# Patient Record
Sex: Female | Born: 1990 | Race: Black or African American | Hispanic: No | Marital: Single | Smoking: Never smoker
Health system: Southern US, Community
[De-identification: ages and names within clinical notes are randomized; demographics above are authoritative.]

## PROBLEM LIST (undated history)

## (undated) DIAGNOSIS — G43909 Migraine, unspecified, not intractable, without status migrainosus: Secondary | ICD-10-CM

## (undated) DIAGNOSIS — R10814 Left lower quadrant abdominal tenderness: Secondary | ICD-10-CM

## (undated) DIAGNOSIS — R87612 Low grade squamous intraepithelial lesion on cytologic smear of cervix (LGSIL): Secondary | ICD-10-CM

## (undated) DIAGNOSIS — J309 Allergic rhinitis, unspecified: Secondary | ICD-10-CM

## (undated) DIAGNOSIS — N83299 Other ovarian cyst, unspecified side: Secondary | ICD-10-CM

## (undated) HISTORY — DX: Low grade squamous intraepithelial lesion on cytologic smear of cervix (LGSIL): R87.612

## (undated) HISTORY — PX: DILATION AND CURETTAGE OF UTERUS: SHX78

## (undated) HISTORY — DX: Migraine, unspecified, not intractable, without status migrainosus: G43.909

## (undated) HISTORY — DX: Left lower quadrant abdominal tenderness: R10.814

## (undated) HISTORY — DX: Allergic rhinitis, unspecified: J30.9

## (undated) HISTORY — PX: OTHER SURGICAL HISTORY: SHX169

## (undated) HISTORY — DX: Other ovarian cyst, unspecified side: N83.299

---
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
## 2008-07-26 ENCOUNTER — Other Ambulatory Visit: Admission: RE | Admit: 2008-07-26 | Discharge: 2008-07-26 | Payer: Self-pay | Admitting: Gynecology

## 2008-09-28 ENCOUNTER — Ambulatory Visit: Payer: Self-pay | Admitting: Women's Health

## 2008-10-06 ENCOUNTER — Ambulatory Visit: Payer: Self-pay | Admitting: Women's Health

## 2008-12-11 ENCOUNTER — Ambulatory Visit: Payer: Self-pay | Admitting: Gynecology

## 2009-07-18 ENCOUNTER — Ambulatory Visit: Payer: Self-pay | Admitting: Women's Health

## 2009-11-19 ENCOUNTER — Ambulatory Visit: Payer: Self-pay | Admitting: Diagnostic Radiology

## 2009-11-19 ENCOUNTER — Emergency Department (HOSPITAL_BASED_OUTPATIENT_CLINIC_OR_DEPARTMENT_OTHER): Admission: EM | Admit: 2009-11-19 | Discharge: 2009-11-19 | Payer: Self-pay | Admitting: Emergency Medicine

## 2014-10-03 ENCOUNTER — Ambulatory Visit: Payer: Self-pay | Admitting: Women's Health

## 2014-10-11 ENCOUNTER — Encounter: Payer: Self-pay | Admitting: Gynecologic Oncology

## 2014-10-13 ENCOUNTER — Ambulatory Visit: Payer: 59 | Attending: Gynecologic Oncology | Admitting: Gynecologic Oncology

## 2014-10-13 ENCOUNTER — Ambulatory Visit: Payer: 59

## 2014-10-13 ENCOUNTER — Encounter: Payer: Self-pay | Admitting: Gynecologic Oncology

## 2014-10-13 VITALS — BP 104/71 | HR 82 | Resp 20 | Ht 68.5 in | Wt 166.7 lb

## 2014-10-13 DIAGNOSIS — Z79899 Other long term (current) drug therapy: Secondary | ICD-10-CM | POA: Insufficient documentation

## 2014-10-13 DIAGNOSIS — R971 Elevated cancer antigen 125 [CA 125]: Secondary | ICD-10-CM

## 2014-10-13 DIAGNOSIS — N839 Noninflammatory disorder of ovary, fallopian tube and broad ligament, unspecified: Secondary | ICD-10-CM | POA: Diagnosis not present

## 2014-10-13 DIAGNOSIS — N83201 Unspecified ovarian cyst, right side: Secondary | ICD-10-CM

## 2014-10-13 DIAGNOSIS — Z803 Family history of malignant neoplasm of breast: Secondary | ICD-10-CM | POA: Insufficient documentation

## 2014-10-13 DIAGNOSIS — N8320 Unspecified ovarian cysts: Secondary | ICD-10-CM | POA: Diagnosis not present

## 2014-10-13 DIAGNOSIS — Z791 Long term (current) use of non-steroidal anti-inflammatories (NSAID): Secondary | ICD-10-CM | POA: Insufficient documentation

## 2014-10-13 DIAGNOSIS — N838 Other noninflammatory disorders of ovary, fallopian tube and broad ligament: Secondary | ICD-10-CM

## 2014-10-13 DIAGNOSIS — N83202 Unspecified ovarian cyst, left side: Secondary | ICD-10-CM

## 2014-10-13 LAB — LACTATE DEHYDROGENASE (CC13): LDH: 179 U/L (ref 125–245)

## 2014-10-13 NOTE — Patient Instructions (Addendum)
Plan for your pre-op appointment at [facility] on November 2 at 11:00am and surgery on November 6 with Dr. Nancy Marus.  We will contact you about your lab work from today and with your CT scan results when available.  Please call for any questions or concerns.

## 2014-10-13 NOTE — Progress Notes (Signed)
Consult Note: Gyn-Onc  Consult was requested by Dr. Carren Rang for the evaluation of Monica Pratt 23 y.o. female with bilateral ovarian cysts  CC:  Chief Complaint  Patient presents with  . Ovarian Masses    New patient    Assessment/Plan:  Monica Pratt  is a 23 y.o.  year old [id number] who is seen in consultation at the request of Dr Carren Rang for bilateral complex ovarian cysts and an elevated CA 125.  I discussed with Monica Pratt that I recommend cyst removal. It is unclear if these are malignant or benign and elevations in CA 125 can occur in the setting of benign or LMP tumors of the ovaries. In order to preserve fertility and endogenous hormonal function, I endorse attempting to remove the cysts and preserve any normal ovarian tissue that is possible. I explained to Monica Pratt that this is not always possible. I also explained that in an attempt to perform an ovarian cystectomy, cyst rupture may occur, and if so, this can upstage a cancer should one exist.  I discussed that if a cancer is diagnosed on frozen section of the cysts, we would recommend completion BSO. I discussed the possibility of retaining her uterus if it is not apparently involved by cancer as this would maintain a potential for donor egg and IVF pregnancy. The patient is not interested in this option and elects for completion hyst, bso if malignancy is diagnosed at the time of frozen section. She expressed understanding that this would result in permanent infertility.  I believe that a minimally invasive approach could be attempted for her with robotic assistance (with the increased range of motion of the instruments facilitating approaching these large masses). However, she understands that conversion to laparotomy may be necessary to accomplish the surgery more safely. I have scheduled her for a robotic bilateral ovarian cystectomies, possible BSO, possible staging. In order to facilitate a sooner operative date, we will attempt to have this  scheduled with one of my partners.  I have ordered additional tumor markers (for germ and stromal cell tumors) which are more common cell type malignancies in women of this age group. I have also ordered a CT of the abdomen and pelvis to rule out apparent bulky upper abdominal or metastatic disease which would change our plans for operative approach and counseling.  HPI: Monica Pratt is a very pleasant 23 year old [id number] who has bilateral complex ovarian cysts.  In 2012 (during her first pregnancy) a "large" ovarian cyst was identified on the right. The patient elected for expectant management before and after the pregnancy. It's features on ultrasound were felt to be consistent with a dermoid cyst.  In July 2015 she began feeling uncomfortable and bloated and sought consultation again with a gynecologist. An ultrasound was performed which showed an intrauterine pregnancy in addition to a right ovarian cyst (normal uterus, left ovary normal with small corpus luteum, complex right oarian cyst extending to the mid abdomen, measuring 17.2cm x 10cm. The walls were thin and smoothly marginated). The mass is largely cystic with internal septation and solid component centrally). The pregnancy was subsequently terminated and the patient underwent a followup US in September 2015. It revealed bilateral ovarian cysts (right 12cm with cystic and solid (+perfusion components), left 13.6cm with solid and cystic compenents (+perfusion to solid areas)).  In October 2015 she saw Dr Carren Rang for a new consultation and a third [place] was performed (on 10/03/14) which showed a left ovary measuring 11.5x5.5x7.9cm and a right ovary with  11.7x8.8x10.4cm. "probably dermoids". They were irregularly shaped, hyperechoic, with solid mass within (4cm). There was no fluid in the cul de sac.  CA 125 was drawn on 10/03/14 and was elevated at 445 U/mL.  The patient reports persistent discomfort and bloating and occasional sharp left lower quadrant  pains. She reports normal menses with no intermenstrual bleeding. She uses loestrin OCP. Her only familial cancer history is an aunt with breast cancer at age 57.  Interval History: She continues to have the above stated symptoms. She is in between jobs at present.  Current Meds:  Outpatient Encounter Prescriptions as of 10/13/2014  Medication Sig  . docusate sodium (COLACE) 100 MG capsule Take 100 mg by mouth 2 (two) times daily as needed for mild constipation.  Marland Kitchen ibuprofen (ADVIL,MOTRIN) 800 MG tablet Take 800 mg by mouth every 8 (eight) hours as needed.    Allergy:  Allergies  Allergen Reactions  . Imitrex [Sumatriptan]     Angioedema    Social Hx:   History   Social History  . Marital Status: Single    Spouse Name: N/A    Number of Children: N/A  . Years of Education: N/A   Occupational History  . Not on file.   Social History Main Topics  . Smoking status: Never Smoker   . Smokeless tobacco: Not on file  . Alcohol Use: Yes     Comment: socially  . Drug Use: No  . Sexual Activity: Not on file   Other Topics Concern  . Not on file   Social History Narrative  . No narrative on file    Past Surgical Hx:  Past Surgical History  Procedure Laterality Date  . Cesarean section      Past Medical Hx:  Past Medical History  Diagnosis Date  . Allergic rhinitis   . Migraine   . Left lower quadrant abdominal tenderness   . Complex ovarian cyst     Past Gynecological History:  Cesarean x 1. Loestrin OCP use. EAbx 2. No hx of abnormal pap smears.  No LMP recorded.  Family Hx:  Family History  Problem Relation Age of Onset  . Asthma Father   . Migraines Father   . Asthma Brother   . Asthma Paternal Uncle   . Cancer Paternal Aunt 20    breast    Review of Systems:  Constitutional  Feels well,    ENT Normal appearing ears and nares bilaterally Skin/Breast  No rash, sores, jaundice, itching, dryness Cardiovascular  No chest pain, shortness of  breath, or edema  Pulmonary  No cough or wheeze.  Gastro Intestinal  No nausea, vomitting, or diarrhoea. No bright red blood per rectum, + intermittent constipation.  +vague bloating and low left abdominal pains. Genito Urinary  No frequency, urgency, dysuria,  Musculo Skeletal  No myalgia, arthralgia, joint swelling or pain  Neurologic  No weakness, numbness, change in gait,  Psychology  No depression, anxiety, insomnia.   Vitals:  Blood pressure 104/71, pulse 82, resp. rate 20, height 5' 8.5" (1.74 m), weight 166 lb 11.2 oz (75.615 kg).  Physical Exam: WD in NAD Neck  Supple NROM, without any enlargements.  Lymph Node Survey No cervical supraclavicular or inguinal adenopathy Cardiovascular  Pulse normal rate, regularity and rhythm. S1 and S2 normal.  Lungs  Clear to auscultation bilateraly, without wheezes/crackles/rhonchi. Good air movement.  Skin  No rash/lesions/breakdown  Psychiatry  Alert and oriented to person, place, and time  Abdomen  Normoactive bowel sounds, abdomen  soft, non-tender and thin without evidence of hernia.  Back No CVA tenderness Genito Urinary  Vulva/vagina: Normal external female genitalia.  No lesions. No discharge or bleeding.  Bladder/urethra:  No lesions or masses, well supported bladder  Vagina: normal appearing  Cervix: Displaced posteriorally due to pelvic masses. Normal appearing, no lesions.  Uterus: Small, mobile, no parametrial involvement or nodularity.  Adnexa: smooth cystic masses in the mid pelvis. No palpable adherence to the pelvic floor or rectum. Rectal  Good tone, no masses no cul de sac nodularity.  Extremities  No bilateral cyanosis, clubbing or edema.   Donaciano Eva, MD  10/13/2014, 3:17 PM

## 2014-10-17 LAB — AFP TUMOR MARKER-PREVIOUS METHOD: AFP Tumor Marker: <1.3 ng/mL (ref 0.0–8.0)

## 2014-10-17 LAB — BETA 2 MICROGLOBULIN, SERUM: Beta-2 Microglobulin: 1.28 mg/L (ref <=2.51)

## 2014-10-17 LAB — BETA HCG QUANT (REF LAB): BETA HCG, TUMOR MARKER: 659 m[IU]/mL — AB (ref <5.0)

## 2014-10-17 LAB — AFP TUMOR MARKER: AFP-Tumor Marker: 2.1 ng/mL (ref <6.1)

## 2014-10-17 LAB — INHIBIN A: INHIBIN-A: 7.4 pg/mL

## 2014-10-18 ENCOUNTER — Encounter (HOSPITAL_COMMUNITY): Payer: Self-pay

## 2014-10-18 ENCOUNTER — Ambulatory Visit (HOSPITAL_COMMUNITY)
Admission: RE | Admit: 2014-10-18 | Discharge: 2014-10-18 | Disposition: A | Discharge status: Home / Self Care | Payer: 59 | Source: Ambulatory Visit | Attending: Gynecologic Oncology | Admitting: Gynecologic Oncology

## 2014-10-18 DIAGNOSIS — D270 Benign neoplasm of right ovary: Secondary | ICD-10-CM | POA: Diagnosis not present

## 2014-10-18 DIAGNOSIS — R971 Elevated cancer antigen 125 [CA 125]: Secondary | ICD-10-CM

## 2014-10-18 DIAGNOSIS — D271 Benign neoplasm of left ovary: Secondary | ICD-10-CM | POA: Diagnosis not present

## 2014-10-18 DIAGNOSIS — R188 Other ascites: Secondary | ICD-10-CM | POA: Diagnosis not present

## 2014-10-18 DIAGNOSIS — N83202 Unspecified ovarian cyst, left side: Secondary | ICD-10-CM

## 2014-10-18 DIAGNOSIS — D259 Leiomyoma of uterus, unspecified: Secondary | ICD-10-CM | POA: Diagnosis not present

## 2014-10-18 DIAGNOSIS — N8320 Unspecified ovarian cysts: Secondary | ICD-10-CM | POA: Diagnosis present

## 2014-10-18 DIAGNOSIS — N83201 Unspecified ovarian cyst, right side: Secondary | ICD-10-CM

## 2014-10-18 DIAGNOSIS — N838 Other noninflammatory disorders of ovary, fallopian tube and broad ligament: Secondary | ICD-10-CM

## 2014-10-18 MED ORDER — IOHEXOL 300 MG/ML  SOLN
100.0000 mL | Freq: Once | INTRAMUSCULAR | Status: AC | PRN
  Administered 2014-10-18: 100 mL via INTRAVENOUS

## 2014-10-19 ENCOUNTER — Telehealth: Payer: Self-pay | Admitting: Gynecologic Oncology

## 2014-10-19 NOTE — Telephone Encounter (Signed)
Informed patient of results. Discussed that she has an elevated HCG. The patient reports termination of pregnancy on 09/30/14 which is only 1 week prior to having the HCG level drawn as a tumor marker. I feel this most likely reflects the status of recent pregnancy and not active pregnancy or choriocarcinoma. I recommend rechecking this value next week to confirm it is normalizing.  I personally reviewed the CT images in addition to the report. The images look most consistent with a benign cystic process. They are mostly cystic but do fill the pelvis into the upper abdomen. I will discuss the case further with her treating surgeon regarding feasibility of a minimally invasive approach.  We will also recheck her CA 125, as now she is no longer pregnant, we may see a reduction in this level which will be further reassuring.  Donaciano Eva, MD

## 2014-11-02 ENCOUNTER — Ambulatory Visit: Payer: 59 | Attending: Gynecologic Oncology | Admitting: Gynecologic Oncology

## 2014-11-02 ENCOUNTER — Encounter: Payer: Self-pay | Admitting: Gynecologic Oncology

## 2014-11-02 ENCOUNTER — Ambulatory Visit: Payer: 59

## 2014-11-02 VITALS — BP 108/73 | HR 93 | Temp 98.3°F | Resp 18 | Ht 68.5 in | Wt 168.9 lb

## 2014-11-02 DIAGNOSIS — N839 Noninflammatory disorder of ovary, fallopian tube and broad ligament, unspecified: Secondary | ICD-10-CM

## 2014-11-02 DIAGNOSIS — N838 Other noninflammatory disorders of ovary, fallopian tube and broad ligament: Secondary | ICD-10-CM | POA: Diagnosis present

## 2014-11-02 DIAGNOSIS — R319 Hematuria, unspecified: Secondary | ICD-10-CM

## 2014-11-02 DIAGNOSIS — N8320 Unspecified ovarian cysts: Secondary | ICD-10-CM | POA: Insufficient documentation

## 2014-11-02 DIAGNOSIS — N390 Urinary tract infection, site not specified: Secondary | ICD-10-CM

## 2014-11-02 LAB — URINALYSIS, MICROSCOPIC - CHCC
Bilirubin (Urine): NEGATIVE
Glucose: NEGATIVE mg/dL
Ketones: NEGATIVE mg/dL
NITRITE: NEGATIVE
PH: 8.5 (ref 4.6–8.0)
PROTEIN: 100 mg/dL
Specific Gravity, Urine: 1.01 (ref 1.003–1.035)
UROBILINOGEN UR: 0.2 mg/dL (ref 0.2–1)

## 2014-11-02 MED ORDER — CIPROFLOXACIN HCL 500 MG PO TABS: 500.0000 mg | ORAL_TABLET | Freq: Two times a day (BID) | ORAL | Status: DC

## 2014-11-02 NOTE — Progress Notes (Signed)
Follow Up Note: Gyn-Onc  Monica Pratt 23 y.o. female  CC:  Chief Complaint  Patient presents with  . Ovarian mass    Post-op follow up  . Trial Voiding    HPI:  Monica Pratt is a 23 year old, G3P1, referred by Dr. Carren Rang for bilateral complex ovarian cysts.  In 2012 (during her first pregnancy), a "large" ovarian cyst was identified on the right and based on ultrasound findings, it was thought to be a dermoid cyst.  Expectant management before and after the pregnancy was selected at that time.  In July 2015, she sought the care of a gynecologist for evaluation of abdominal discomfort and bloating. An ultrasound performed at that time revealed an intrauterine pregnancy in addition to a right ovarian cyst (normal uterus, left ovary normal with small corpus luteum, complex right ovarian cyst extending to the mid abdomen, measuring 17.2cm x 10cm. The walls were thin and smoothly marginated). The mass was largely cystic with internal septation and solid component centrally). The pregnancy was subsequently terminated and the patient underwent a followup US in September 2015. It revealed bilateral ovarian cysts (right 12cm with cystic and solid (+perfusion components), left 13.6cm with solid and cystic compenents (+perfusion to solid areas)).  Dr Carren Rang was consulted in October 2015 for a new patient consultation.  On 10/03/14, an [place] was performed, which showed a left ovary measuring 11.5x5.5x7.9cm and a right ovary with 11.7x8.8x10.4cm noted as "probably dermoids". They were irregularly shaped, hyperechoic, with solid mass within (4cm). There was no fluid in the cul de sac.  CA 125 was drawn on 10/03/14 and was elevated at 445 U/mL.  She was seen by Dr. Denman George with Gynecologic Oncology on 10/13/14.  Surgical intervention was recommended at that time and she was referred to [facility] for an earlier OR date with Dr. Nancy Marus.  Tumor markers were drawn on 10/13/14 and resulted: HCG 659.0, AFP 2.1, Inhibin A 7.4,  and LDH 179.  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  12.0 cm left ovarian dermoid.  Suspected 2.1 cm intramural right uterine body fibroid.   On 10/27/14, she underwent a removal of bilobed right-sided dermoid ovarian cyst via pfannenstiel incision by Dr. Nancy Marus.  Operative findings included: 1. Bilobed right-sided ovarian cyst c/w dermoid. Initial cyst encountered was within the ovarian parenchyma, measuring approximately 10cm. This drained clear fluid. It contained two small nodules, one measuring approximately 3cm and another measuring approximately 1cm that contained thick, green-yellow substance and hair. The second lobe of the dermoid was adherent to the omentum and measured approximately 10cm as well. This drained clear yellow fluid. There were a few small nodules within containing a thicker fluid; however, these were not drained. This cyst was removed in it's entirety and sent to pathology. Intra-operative frozen pathology was initially ordered; however, this was canceled following a discussion with pathology who noted that the gross appearance was consistent with dermoid, which is not able to undergo frozen section secondary to calcium deposits. 2. Normal appearing uterus and left tube and ovary. Normal appearing right fallopian tube. 3. Normal appearing appendix. 4. Omentum examined with no further abnormalities identified.  Final pathology still under review at this time.  Post-operatively, a voiding trial was unsuccessful and she was discharged home with a foley catheter with plans for another voiding trial on November 12 in [place].   Interval History:  She presents today for foley removal and for a voiding trial status post discharge from [facility].  She states she has been doing well but she feels she has a UTI from the catheter.  Reporting burning around the urethra, blood-tinged urine, and frothy urine that developed two days ago.  Denies fever or chills.   Adequate PO intake reported.  Minimal pain and adequate pain relief reported with percocet use intermittently.  She has not had a bowel movement since surgery but has been taking stool softeners twice daily.  No concerns voiced about her incision.  Denies erythema or drainage.  No concerns voiced.    Review of Systems  Constitutional: Feels well.  No fever, chills, weakness, fatigue, early satiety, appetite changes, unintentional weight loss or gain.  Cardiovascular: No chest pain, shortness of breath, or edema.  Pulmonary: No cough or wheeze.  Gastrointestinal: No nausea, vomiting, or diarrhea. No bright red blood per rectum or change in bowel movement.  Genitourinary: Positive for hematuria, frothy urine, discomfort around the urethra.  No vaginal bleeding or discharge.  Musculoskeletal: No myalgia or joint pain. Neurologic: No weakness, numbness, or change in gait.  Psychology: No depression, anxiety, or insomnia.  Current Meds:  Outpatient Encounter Prescriptions as of 11/02/2014  Medication Sig  . docusate sodium (COLACE) 100 MG capsule Take 100 mg by mouth 2 (two) times daily as needed for mild constipation.  Marland Kitchen ibuprofen (ADVIL,MOTRIN) 800 MG tablet Take 800 mg by mouth every 8 (eight) hours as needed.  Marland Kitchen oxyCODONE-acetaminophen (PERCOCET/ROXICET) 5-325 MG per tablet Take by mouth.  . ciprofloxacin (CIPRO) 500 MG tablet Take 1 tablet (500 mg total) by mouth 2 (two) times daily.    Allergy:  Allergies  Allergen Reactions  . Imitrex [Sumatriptan]     Angioedema    Social Hx:   History   Social History  . Marital Status: Single    Spouse Name: N/A    Number of Children: N/A  . Years of Education: N/A   Occupational History  . Not on file.   Social History Main Topics  . Smoking status: Never Smoker   . Smokeless tobacco: Not on file  . Alcohol Use: Yes     Comment: socially  . Drug Use: No  . Sexual Activity: No   Other Topics Concern  . Not on file   Social  History Narrative    Past Surgical Hx:  Past Surgical History  Procedure Laterality Date  . Cesarean section      Past Medical Hx:  Past Medical History  Diagnosis Date  . Allergic rhinitis   . Migraine   . Left lower quadrant abdominal tenderness   . Complex ovarian cyst     Family Hx:  Family History  Problem Relation Age of Onset  . Asthma Father   . Migraines Father   . Asthma Brother   . Asthma Paternal Uncle   . Cancer Paternal Aunt 54    breast    Vitals:  Blood pressure 108/73, pulse 93, temperature 98.3 F (36.8 C), resp. rate 18, height 5' 8.5" (1.74 m), weight 168 lb 14.4 oz (76.613 kg), last menstrual period 09/30/2014.  Physical Exam:  General: Well developed, well nourished female in no acute distress. Alert and oriented x 3.  Cardiovascular: Regular rate and rhythm. S1 and S2 normal.  Lungs: Clear to auscultation bilaterally. No wheezes/crackles/rhonchi noted.  Skin: No rashes or lesions present. Back: No CVA tenderness.  Abdomen: Abdomen soft, non-tender and non-obese. Active bowel sounds in all quadrants. No evidence of a fluid wave or abdominal masses.  Low transverse  incision with steri strips without erythema or drainage.  Extremities: No bilateral cyanosis, edema, or clubbing. Foley clamped at 11:51am and pink-tinged, frothy urine sample obtained.  100 cc of normal saline instilled in the bladder via the foley and the catheter was removed at 12:10 pm.  Patient immediately had the urge to void.  200 cc of pink-tinged urine voided at 12:16pm.   Assessment/Plan:  23 year old s/p removal of bilobed right-sided dermoid ovarian cyst via pfannenstiel incision by Dr. Nancy Marus.  Final pathology pending.  Successful voiding trial after foley removal today.  We will sent a urine sample for urinalysis and urine culture to rule out a urinary tract infection.  She is to follow up as scheduled or sooner if needed.  Reportable signs and symptoms reviewed.  Update  14:00: Ref Range 1d ago     Glucose Negative mg/dL Negative   Bilirubin (Urine) Negative  Negative   Ketones Negative mg/dL Negative   Specific Gravity, Urine 1.003 - 1.035  1.010   Blood Negative  Large   pH 4.6 - 8.0  8.5   Protein Negative- <30 mg/dL 100   Urobilinogen, UR 0.2 - 1 mg/dL 0.2   Nitrite Negative  Negative   Leukocyte Esterase Negative  Trace   RBC / HPF 0 - 2  TNTC   WBC, UA 0 - 2  0-2   Bacteria, UA Negative- Trace  Moderate   Epithelial Cells Negative- Few  Few   Amorphous, UA Negative- Small  Small    Message left for patient about UA results and new prescription for cipro 500 mg BID x 7days.  Dr. Alycia Rossetti notified as well via email.  Patient advised to call the office for any questions or concerns.     Viraaj Vorndran DEAL, NP 11/03/2014, 1:40 PM

## 2014-11-03 ENCOUNTER — Encounter: Payer: Self-pay | Admitting: Gynecologic Oncology

## 2014-11-03 LAB — URINE CULTURE

## 2014-11-03 NOTE — Addendum Note (Signed)
Addended by: Joylene John D on: 11/03/2014 03:44 PM   Modules accepted: Level of Service

## 2014-11-06 ENCOUNTER — Telehealth: Payer: Self-pay | Admitting: Gynecologic Oncology

## 2014-11-06 NOTE — Telephone Encounter (Signed)
Called to check on patient's current status.  Informed of urine culture results: no growth.  Tolerating the antibiotics well.  Denies dysuria, hematuria, fever, chills.  Voiding without difficulty.  Advised to call for any questions or concerns.  Reportable signs and symptoms reviewed.

## 2014-11-20 ENCOUNTER — Ambulatory Visit: Payer: 59 | Admitting: Gynecologic Oncology

## 2015-02-09 ENCOUNTER — Ambulatory Visit (INDEPENDENT_AMBULATORY_CARE_PROVIDER_SITE_OTHER): Payer: BLUE CROSS/BLUE SHIELD | Admitting: Women's Health

## 2015-02-09 ENCOUNTER — Encounter: Payer: Self-pay | Admitting: Women's Health

## 2015-02-09 VITALS — BP 115/70 | Ht 69.0 in | Wt 176.0 lb

## 2015-02-09 DIAGNOSIS — R350 Frequency of micturition: Secondary | ICD-10-CM

## 2015-02-09 DIAGNOSIS — N3000 Acute cystitis without hematuria: Secondary | ICD-10-CM

## 2015-02-09 DIAGNOSIS — N898 Other specified noninflammatory disorders of vagina: Secondary | ICD-10-CM

## 2015-02-09 DIAGNOSIS — A599 Trichomoniasis, unspecified: Secondary | ICD-10-CM

## 2015-02-09 LAB — WET PREP FOR TRICH, YEAST, CLUE: Yeast Wet Prep HPF POC: NONE SEEN

## 2015-02-09 LAB — URINALYSIS W MICROSCOPIC + REFLEX CULTURE
BILIRUBIN URINE: NEGATIVE
Casts: NONE SEEN
Crystals: NONE SEEN
GLUCOSE, UA: NEGATIVE mg/dL
Ketones, ur: NEGATIVE mg/dL
Nitrite: NEGATIVE
PH: 7 (ref 5.0–8.0)
Protein, ur: NEGATIVE mg/dL
SPECIFIC GRAVITY, URINE: 1.015 (ref 1.005–1.030)

## 2015-02-09 MED ORDER — SULFAMETHOXAZOLE-TRIMETHOPRIM 800-160 MG PO TABS: 1.0000 | ORAL_TABLET | Freq: Two times a day (BID) | ORAL | Status: DC

## 2015-02-09 MED ORDER — FLUCONAZOLE 150 MG PO TABS: 150.0000 mg | ORAL_TABLET | Freq: Once | ORAL | Status: DC

## 2015-02-09 MED ORDER — METRONIDAZOLE 500 MG PO TABS: ORAL_TABLET | ORAL | Status: DC

## 2015-02-09 NOTE — Progress Notes (Signed)
Patient ID: Monica Pratt, female   DOB: 20-Feb-1991, 24 y.o.   MRN: [id number] Presents with complaint of vaginal discharge with irritation, itching and odor for past 2 weeks. No relief with over-the-counter Monistat. Increased urinary frequency, urgency, pain and burning for the past week. Denies abdominal pain or fever. Reports negative HIV, hepatitis and RPR  November 2015- had a benign cystectomy in [place]. Reports not having GC/Chlamydia. Not sexually active in several months. Monthly cycle/condoms. Lives and works in [place], family lives here in [place].  Exam: Appears well. External genitalia erythematous, speculum exam copious malodorous milky discharge noted, wet prep positive for many Trichomonas,  GC/Chlamydia culture taken. I manually CMT or adnexal fullness or tenderness. UA: Trace blood, large leukocytes, TNTC WBCs, 3-6 RBCs, TNTC bacteria  Trichomonas UTI Contraception management  Plan: 1. Flagyl 2 g by mouth 1 dose, alcohol precautions reviewed. 2. Septra twice daily for 3 days #6, prescription, proper use given and reviewed. Urine culture pending. 3. Contraception options reviewed, Mirena IUD information reviewed slight risk for infection, perforation, hemorrhage, reviewed if chooses to have placed here to call office to check coverage, Dr. Toney Rakes to place with cycle. Instructed to call if no relief of discharge or urinary symptoms. Prescription for Diflucan 150 by mouth 1 dose given if vaginal itching persists.

## 2015-02-09 NOTE — Patient Instructions (Addendum)
Trichomoniasis Trichomoniasis is an infection caused by an organism called Trichomonas. The infection can affect both women and men. In women, the outer female genitalia and the vagina are affected. In men, the penis is mainly affected, but the prostate and other reproductive organs can also be involved. Trichomoniasis is a sexually transmitted infection (STI) and is most often passed to another person through sexual contact.  RISK FACTORS  Having unprotected sexual intercourse.  Having sexual intercourse with an infected partner. SIGNS AND SYMPTOMS  Symptoms of trichomoniasis in women include:  Abnormal gray-green frothy vaginal discharge.  Itching and irritation of the vagina.  Itching and irritation of the area outside the vagina. Symptoms of trichomoniasis in men include:   Penile discharge with or without pain.  Pain during urination. This results from inflammation of the urethra. DIAGNOSIS  Trichomoniasis may be found during a Pap test or physical exam. Your health care provider may use one of the following methods to help diagnose this infection:  Examining vaginal discharge under a microscope. For men, urethral discharge would be examined.  Testing the pH of the vagina with a test tape.  Using a vaginal swab test that checks for the Trichomonas organism. A test is available that provides results within a few minutes.  Doing a culture test for the organism. This is not usually needed. TREATMENT   You may be given medicine to fight the infection. Women should inform their health care provider if they could be or are pregnant. Some medicines used to treat the infection should not be taken during pregnancy.  Your health care provider may recommend over-the-counter medicines or creams to decrease itching or irritation.  Your sexual partner will need to be treated if infected. HOME CARE INSTRUCTIONS   Take medicines only as directed by your health care provider.  Take  over-the-counter medicine for itching or irritation as directed by your health care provider.  Do not have sexual intercourse while you have the infection.  Women should not douche or wear tampons while they have the infection.  Discuss your infection with your partner. Your partner may have gotten the infection from you, or you may have gotten it from your partner.  Have your sex partner get examined and treated if necessary.  Practice safe, informed, and protected sex.  See your health care provider for other STI testing. SEEK MEDICAL CARE IF:   You still have symptoms after you finish your medicine.  You develop abdominal pain.  You have pain when you urinate.  You have bleeding after sexual intercourse.  You develop a rash.  Your medicine makes you sick or makes you throw up (vomit). MAKE SURE YOU:  Understand these instructions.  Will watch your condition.  Will get help right away if you are not doing well or get worse. Document Released: 06/03/2001 Document Revised: 04/24/2014 Document Reviewed: 09/19/2013 [facility] Patient Information 2015 [place], [place]. This information is not intended to replace advice given to you by your health care provider. Make sure you discuss any questions you have with your health care provider. Levonorgestrel intrauterine device (IUD) What is this medicine? LEVONORGESTREL IUD (LEE voe nor jes trel) is a contraceptive (birth control) device. The device is placed inside the uterus by a healthcare professional. It is used to prevent pregnancy and can also be used to treat heavy bleeding that occurs during your period. Depending on the device, it can be used for 3 to 5 years. This medicine may be used for other purposes; ask  your health care provider or pharmacist if you have questions. COMMON BRAND NAME(S): Verda Cumins What should I tell my health care provider before I take this medicine? They need to know if you have any of these  conditions: -abnormal Pap smear -cancer of the breast, uterus, or cervix -diabetes -endometritis -genital or pelvic infection now or in the past -have more than one sexual partner or your partner has more than one partner -heart disease -history of an ectopic or tubal pregnancy -immune system problems -IUD in place -liver disease or tumor -problems with blood clots or take blood-thinners -use intravenous drugs -uterus of unusual shape -vaginal bleeding that has not been explained -an unusual or allergic reaction to levonorgestrel, other hormones, silicone, or polyethylene, medicines, foods, dyes, or preservatives -pregnant or trying to get pregnant -breast-feeding How should I use this medicine? This device is placed inside the uterus by a health care professional. Talk to your pediatrician regarding the use of this medicine in children. Special care may be needed. Overdosage: If you think you have taken too much of this medicine contact a poison control center or emergency room at once. NOTE: This medicine is only for you. Do not share this medicine with others. What if I miss a dose? This does not apply. What may interact with this medicine? Do not take this medicine with any of the following medications: -amprenavir -bosentan -fosamprenavir This medicine may also interact with the following medications: -aprepitant -barbiturate medicines for inducing sleep or treating seizures -bexarotene -griseofulvin -medicines to treat seizures like carbamazepine, ethotoin, felbamate, oxcarbazepine, phenytoin, topiramate -modafinil -pioglitazone -rifabutin -rifampin -rifapentine -some medicines to treat HIV infection like atazanavir, indinavir, lopinavir, nelfinavir, tipranavir, ritonavir -St. John's wort -warfarin This list may not describe all possible interactions. Give your health care provider a list of all the medicines, herbs, non-prescription drugs, or dietary supplements  you use. Also tell them if you smoke, drink alcohol, or use illegal drugs. Some items may interact with your medicine. What should I watch for while using this medicine? Visit your doctor or health care professional for regular check ups. See your doctor if you or your partner has sexual contact with others, becomes HIV positive, or gets a sexual transmitted disease. This product does not protect you against HIV infection (AIDS) or other sexually transmitted diseases. You can check the placement of the IUD yourself by reaching up to the top of your vagina with clean fingers to feel the threads. Do not pull on the threads. It is a good habit to check placement after each menstrual period. Call your doctor right away if you feel more of the IUD than just the threads or if you cannot feel the threads at all. The IUD may come out by itself. You may become pregnant if the device comes out. If you notice that the IUD has come out use a backup birth control method like condoms and call your health care provider. Using tampons will not change the position of the IUD and are okay to use during your period. What side effects may I notice from receiving this medicine? Side effects that you should report to your doctor or health care professional as soon as possible: -allergic reactions like skin rash, itching or hives, swelling of the face, lips, or tongue -fever, flu-like symptoms -genital sores -high blood pressure -no menstrual period for 6 weeks during use -pain, swelling, warmth in the leg -pelvic pain or tenderness -severe or sudden headache -signs of pregnancy -stomach cramping -  sudden shortness of breath -trouble with balance, talking, or walking -unusual vaginal bleeding, discharge -yellowing of the eyes or skin Side effects that usually do not require medical attention (report to your doctor or health care professional if they continue or are bothersome): -acne -breast pain -change in sex  drive or performance -changes in weight -cramping, dizziness, or faintness while the device is being inserted -headache -irregular menstrual bleeding within first 3 to 6 months of use -nausea This list may not describe all possible side effects. Call your doctor for medical advice about side effects. You may report side effects to FDA at 1-800-FDA-1088. Where should I keep my medicine? This does not apply. NOTE: This sheet is a summary. It may not cover all possible information. If you have questions about this medicine, talk to your doctor, pharmacist, or health care provider.  2015, [employer]. (2012-01-08 13:54:04) Urinary Tract Infection Urinary tract infections (UTIs) can develop anywhere along your urinary tract. Your urinary tract is your body's drainage system for removing wastes and extra water. Your urinary tract includes two kidneys, two ureters, a bladder, and a urethra. Your kidneys are a pair of bean-shaped organs. Each kidney is about the size of your fist. They are located below your ribs, one on each side of your spine. CAUSES Infections are caused by microbes, which are microscopic organisms, including fungi, viruses, and bacteria. These organisms are so small that they can only be seen through a microscope. Bacteria are the microbes that most commonly cause UTIs. SYMPTOMS  Symptoms of UTIs may vary by age and gender of the patient and by the location of the infection. Symptoms in young women typically include a frequent and intense urge to urinate and a painful, burning feeling in the bladder or urethra during urination. Older women and men are more likely to be tired, shaky, and weak and have muscle aches and abdominal pain. A fever may mean the infection is in your kidneys. Other symptoms of a kidney infection include pain in your back or sides below the ribs, nausea, and vomiting. DIAGNOSIS To diagnose a UTI, your caregiver will ask you about your symptoms. Your  caregiver also will ask to provide a urine sample. The urine sample will be tested for bacteria and white blood cells. White blood cells are made by your body to help fight infection. TREATMENT  Typically, UTIs can be treated with medication. Because most UTIs are caused by a bacterial infection, they usually can be treated with the use of antibiotics. The choice of antibiotic and length of treatment depend on your symptoms and the type of bacteria causing your infection. HOME CARE INSTRUCTIONS  If you were prescribed antibiotics, take them exactly as your caregiver instructs you. Finish the medication even if you feel better after you have only taken some of the medication.  Drink enough water and fluids to keep your urine clear or pale yellow.  Avoid caffeine, tea, and carbonated beverages. They tend to irritate your bladder.  Empty your bladder often. Avoid holding urine for long periods of time.  Empty your bladder before and after sexual intercourse.  After a bowel movement, women should cleanse from front to back. Use each tissue only once. SEEK MEDICAL CARE IF:   You have back pain.  You develop a fever.  Your symptoms do not begin to resolve within 3 days. SEEK IMMEDIATE MEDICAL CARE IF:   You have severe back pain or lower abdominal pain.  You develop chills.  You have  nausea or vomiting.  You have continued burning or discomfort with urination. MAKE SURE YOU:   Understand these instructions.  Will watch your condition.  Will get help right away if you are not doing well or get worse. Document Released: 09/17/2005 Document Revised: 06/08/2012 Document Reviewed: 01/16/2012 [facility] Patient Information 2015 [place], [place]. This information is not intended to replace advice given to you by your health care provider. Make sure you discuss any questions you have with your health care provider.

## 2015-02-10 LAB — URINE CULTURE

## 2015-02-10 LAB — GC/CHLAMYDIA PROBE AMP
CT Probe RNA: NEGATIVE
GC PROBE AMP APTIMA: NEGATIVE

## 2015-02-23 ENCOUNTER — Encounter: Payer: Self-pay | Admitting: Women's Health

## 2015-03-01 ENCOUNTER — Ambulatory Visit (INDEPENDENT_AMBULATORY_CARE_PROVIDER_SITE_OTHER): Payer: BLUE CROSS/BLUE SHIELD | Admitting: Women's Health

## 2015-03-01 ENCOUNTER — Encounter: Payer: Self-pay | Admitting: Women's Health

## 2015-03-01 ENCOUNTER — Other Ambulatory Visit (HOSPITAL_COMMUNITY)
Admission: RE | Admit: 2015-03-01 | Discharge: 2015-03-01 | Disposition: A | Discharge status: Home / Self Care | Payer: Self-pay | Source: Ambulatory Visit | Attending: Gynecology | Admitting: Gynecology

## 2015-03-01 VITALS — BP 115/76 | Ht 70.0 in | Wt 171.6 lb

## 2015-03-01 DIAGNOSIS — N8320 Unspecified ovarian cysts: Secondary | ICD-10-CM

## 2015-03-01 DIAGNOSIS — N898 Other specified noninflammatory disorders of vagina: Secondary | ICD-10-CM

## 2015-03-01 DIAGNOSIS — Z01419 Encounter for gynecological examination (general) (routine) without abnormal findings: Secondary | ICD-10-CM | POA: Insufficient documentation

## 2015-03-01 DIAGNOSIS — N83202 Unspecified ovarian cyst, left side: Secondary | ICD-10-CM

## 2015-03-01 DIAGNOSIS — Z113 Encounter for screening for infections with a predominantly sexual mode of transmission: Secondary | ICD-10-CM

## 2015-03-01 DIAGNOSIS — N83201 Unspecified ovarian cyst, right side: Secondary | ICD-10-CM

## 2015-03-01 LAB — WET PREP FOR TRICH, YEAST, CLUE: Trich, Wet Prep: NONE SEEN

## 2015-03-01 MED ORDER — FLUCONAZOLE 150 MG PO TABS: 150.0000 mg | ORAL_TABLET | Freq: Once | ORAL | Status: DC

## 2015-03-01 MED ORDER — METRONIDAZOLE 500 MG PO TABS: 500.0000 mg | ORAL_TABLET | Freq: Two times a day (BID) | ORAL | Status: DC

## 2015-03-01 NOTE — Patient Instructions (Signed)
Bacterial Vaginosis Bacterial vaginosis is a vaginal infection that occurs when the normal balance of bacteria in the vagina is disrupted. It results from an overgrowth of certain bacteria. This is the most common vaginal infection in women of childbearing age. Treatment is important to prevent complications, especially in pregnant women, as it can cause a premature delivery. CAUSES  Bacterial vaginosis is caused by an increase in harmful bacteria that are normally present in smaller amounts in the vagina. Several different kinds of bacteria can cause bacterial vaginosis. However, the reason that the condition develops is not fully understood. RISK FACTORS Certain activities or behaviors can put you at an increased risk of developing bacterial vaginosis, including:  Having a new sex partner or multiple sex partners.  Douching.  Using an intrauterine device (IUD) for contraception. Women do not get bacterial vaginosis from toilet seats, bedding, swimming pools, or contact with objects around them. SIGNS AND SYMPTOMS  Some women with bacterial vaginosis have no signs or symptoms. Common symptoms include:  Grey vaginal discharge.  A fishlike odor with discharge, especially after sexual intercourse.  Itching or burning of the vagina and vulva.  Burning or pain with urination. DIAGNOSIS  Your health care provider will take a medical history and examine the vagina for signs of bacterial vaginosis. A sample of vaginal fluid may be taken. Your health care provider will look at this sample under a microscope to check for bacteria and abnormal cells. A vaginal pH test may also be done.  TREATMENT  Bacterial vaginosis may be treated with antibiotic medicines. These may be given in the form of a pill or a vaginal cream. A second round of antibiotics may be prescribed if the condition comes back after treatment.  HOME CARE INSTRUCTIONS   Only take over-the-counter or prescription medicines as  directed by your health care provider.  If antibiotic medicine was prescribed, take it as directed. Make sure you finish it even if you start to feel better.  Do not have sex until treatment is completed.  Tell all sexual partners that you have a vaginal infection. They should see their health care provider and be treated if they have problems, such as a mild rash or itching.  Practice safe sex by using condoms and only having one sex partner. SEEK MEDICAL CARE IF:   Your symptoms are not improving after 3 days of treatment.  You have increased discharge or pain.  You have a fever. MAKE SURE YOU:   Understand these instructions.  Will watch your condition.  Will get help right away if you are not doing well or get worse. FOR MORE INFORMATION  Centers for Disease Control and Prevention, Division of STD Prevention: [employer]raiser[employer]raud[employer] American Sexual Health Association (ASHA): www.ashastd.org  Document Released: 12/08/2005 Document Revised: 09/28/2013 Document Reviewed: 07/20/2013 [facility] Patient Information 2015 [place], [place]. This information is not intended to replace advice given to you by your health care provider. Make sure you discuss any questions you have with your health care provider. Health Maintenance Adopting a healthy lifestyle and getting preventive care can go a long way to promote health and wellness. Talk with your health care provider about what schedule of regular examinations is right for you. This is a good chance for you to check in with your provider about disease prevention and staying healthy. In between checkups, there are plenty of things you can do on your own. Experts have done a lot of research about which lifestyle changes and preventive measures are most  likely to keep you healthy. Ask your health care provider for more information. WEIGHT AND DIET  Eat a healthy diet  Be sure to include plenty of vegetables, fruits, low-fat dairy products, and lean  protein.  Do not eat a lot of foods high in solid fats, added sugars, or salt.  Get regular exercise. This is one of the most important things you can do for your health.  Most adults should exercise for at least 150 minutes each week. The exercise should increase your heart rate and make you sweat (moderate-intensity exercise).  Most adults should also do strengthening exercises at least twice a week. This is in addition to the moderate-intensity exercise.  Maintain a healthy weight  Body mass index (BMI) is a measurement that can be used to identify possible weight problems. It estimates body fat based on height and weight. Your health care provider can help determine your BMI and help you achieve or maintain a healthy weight.  For females 31 years of age and older:   A BMI below 18.5 is considered underweight.  A BMI of 18.5 to 24.9 is normal.  A BMI of 25 to 29.9 is considered overweight.  A BMI of 30 and above is considered obese.  Watch levels of cholesterol and blood lipids  You should start having your blood tested for lipids and cholesterol at 26 years of age, then have this test every 5 years.  You may need to have your cholesterol levels checked more often if:  Your lipid or cholesterol levels are high.  You are older than 26 years of age.  You are at high risk for heart disease.  CANCER SCREENING   Lung Cancer  Lung cancer screening is recommended for adults 74-45 years old who are at high risk for lung cancer because of a history of smoking.  A yearly low-dose CT scan of the lungs is recommended for people who:  Currently smoke.  Have quit within the past 15 years.  Have at least a 30-pack-year history of smoking. A pack year is smoking an average of one pack of cigarettes a day for 1 year.  Yearly screening should continue until it has been 15 years since you quit.  Yearly screening should stop if you develop a health problem that would prevent you  from having lung cancer treatment.  Breast Cancer  Practice breast self-awareness. This means understanding how your breasts normally appear and feel.  It also means doing regular breast self-exams. Let your health care provider know about any changes, no matter how small.  If you are in your 20s or 30s, you should have a clinical breast exam (CBE) by a health care provider every 1-3 years as part of a regular health exam.  If you are 55 or older, have a CBE every year. Also consider having a breast X-ray (mammogram) every year.  If you have a family history of breast cancer, talk to your health care provider about genetic screening.  If you are at high risk for breast cancer, talk to your health care provider about having an MRI and a mammogram every year.  Breast cancer gene (BRCA) assessment is recommended for women who have family members with BRCA-related cancers. BRCA-related cancers include:  Breast.  Ovarian.  Tubal.  Peritoneal cancers.  Results of the assessment will determine the need for genetic counseling and BRCA1 and BRCA2 testing. Cervical Cancer Routine pelvic examinations to screen for cervical cancer are no longer recommended for nonpregnant women  who are considered low risk for cancer of the pelvic organs (ovaries, uterus, and vagina) and who do not have symptoms. A pelvic examination may be necessary if you have symptoms including those associated with pelvic infections. Ask your health care provider if a screening pelvic exam is right for you.   The Pap test is the screening test for cervical cancer for women who are considered at risk.  If you had a hysterectomy for a problem that was not cancer or a condition that could lead to cancer, then you no longer need Pap tests.  If you are older than 65 years, and you have had normal Pap tests for the past 10 years, you no longer need to have Pap tests.  If you have had past treatment for cervical cancer or a  condition that could lead to cancer, you need Pap tests and screening for cancer for at least 20 years after your treatment.  If you no longer get a Pap test, assess your risk factors if they change (such as having a new sexual partner). This can affect whether you should start being screened again.  Some women have medical problems that increase their chance of getting cervical cancer. If this is the case for you, your health care provider may recommend more frequent screening and Pap tests.  The human papillomavirus (HPV) test is another test that may be used for cervical cancer screening. The HPV test looks for the virus that can cause cell changes in the cervix. The cells collected during the Pap test can be tested for HPV.  The HPV test can be used to screen women 1 years of age and older. Getting tested for HPV can extend the interval between normal Pap tests from three to five years.  An HPV test also should be used to screen women of any age who have unclear Pap test results.  After 26 years of age, women should have HPV testing as often as Pap tests.  Colorectal Cancer  This type of cancer can be detected and often prevented.  Routine colorectal cancer screening usually begins at 26 years of age and continues through 26 years of age.  Your health care provider may recommend screening at an earlier age if you have risk factors for colon cancer.  Your health care provider may also recommend using home test kits to check for hidden blood in the stool.  A small camera at the end of a tube can be used to examine your colon directly (sigmoidoscopy or colonoscopy). This is done to check for the earliest forms of colorectal cancer.  Routine screening usually begins at age 28.  Direct examination of the colon should be repeated every 5-10 years through 26 years of age. However, you may need to be screened more often if early forms of precancerous polyps or small growths are found. Skin  Cancer  Check your skin from head to toe regularly.  Tell your health care provider about any new moles or changes in moles, especially if there is a change in a mole's shape or color.  Also tell your health care provider if you have a mole that is larger than the size of a pencil eraser.  Always use sunscreen. Apply sunscreen liberally and repeatedly throughout the day.  Protect yourself by wearing long sleeves, pants, a wide-brimmed hat, and sunglasses whenever you are outside. HEART DISEASE, DIABETES, AND HIGH BLOOD PRESSURE   Have your blood pressure checked at least every 1-2 years. High  blood pressure causes heart disease and increases the risk of stroke.  If you are between 74 years and 62 years old, ask your health care provider if you should take aspirin to prevent strokes.  Have regular diabetes screenings. This involves taking a blood sample to check your fasting blood sugar level.  If you are at a normal weight and have a low risk for diabetes, have this test once every three years after 26 years of age.  If you are overweight and have a high risk for diabetes, consider being tested at a younger age or more often. PREVENTING INFECTION  Hepatitis B  If you have a higher risk for hepatitis B, you should be screened for this virus. You are considered at high risk for hepatitis B if:  You were born in a country where hepatitis B is common. Ask your health care provider which countries are considered high risk.  Your parents were born in a high-risk country, and you have not been immunized against hepatitis B (hepatitis B vaccine).  You have HIV or AIDS.  You use needles to inject street drugs.  You live with someone who has hepatitis B.  You have had sex with someone who has hepatitis B.  You get hemodialysis treatment.  You take certain medicines for conditions, including cancer, organ transplantation, and autoimmune conditions. Hepatitis C  Blood testing is  recommended for:  Everyone born from [birth year] through 1967.  Anyone with known risk factors for hepatitis C. Sexually transmitted infections (STIs)  You should be screened for sexually transmitted infections (STIs) including gonorrhea and chlamydia if:  You are sexually active and are younger than 26 years of age.  You are older than 26 years of age and your health care provider tells you that you are at risk for this type of infection.  Your sexual activity has changed since you were last screened and you are at an increased risk for chlamydia or gonorrhea. Ask your health care provider if you are at risk.  If you do not have HIV, but are at risk, it may be recommended that you take a prescription medicine daily to prevent HIV infection. This is called pre-exposure prophylaxis (PrEP). You are considered at risk if:  You are sexually active and do not regularly use condoms or know the HIV status of your partner(s).  You take drugs by injection.  You are sexually active with a partner who has HIV. Talk with your health care provider about whether you are at high risk of being infected with HIV. If you choose to begin PrEP, you should first be tested for HIV. You should then be tested every 3 months for as long as you are taking PrEP.  PREGNANCY   If you are premenopausal and you may become pregnant, ask your health care provider about preconception counseling.  If you may become pregnant, take 400 to 800 micrograms (mcg) of folic acid every day.  If you want to prevent pregnancy, talk to your health care provider about birth control (contraception). OSTEOPOROSIS AND MENOPAUSE   Osteoporosis is a disease in which the bones lose minerals and strength with aging. This can result in serious bone fractures. Your risk for osteoporosis can be identified using a bone density scan.  If you are 42 years of age or older, or if you are at risk for osteoporosis and fractures, ask your health care  provider if you should be screened.  Ask your health care provider whether you should  take a calcium or vitamin D supplement to lower your risk for osteoporosis.  Menopause may have certain physical symptoms and risks.  Hormone replacement therapy may reduce some of these symptoms and risks. Talk to your health care provider about whether hormone replacement therapy is right for you.  HOME CARE INSTRUCTIONS   Schedule regular health, dental, and eye exams.  Stay current with your immunizations.   Do not use any tobacco products including cigarettes, chewing tobacco, or electronic cigarettes.  If you are pregnant, do not drink alcohol.  If you are breastfeeding, limit how much and how often you drink alcohol.  Limit alcohol intake to no more than 1 drink per day for nonpregnant women. One drink equals 12 ounces of beer, 5 ounces of wine, or 1 ounces of hard liquor.  Do not use street drugs.  Do not share needles.  Ask your health care provider for help if you need support or information about quitting drugs.  Tell your health care provider if you often feel depressed.  Tell your health care provider if you have ever been abused or do not feel safe at home. Document Released: 06/23/2011 Document Revised: 04/24/2014 Document Reviewed: 11/09/2013 [facility] Patient Information 2015 [place], [place]. This information is not intended to replace advice given to you by your health care provider. Make sure you discuss any questions you have with your health care provider.

## 2015-03-01 NOTE — Addendum Note (Signed)
Addended by: Burnett Kanaris on: 03/01/2015 04:05 PM   Modules accepted: Orders

## 2015-03-01 NOTE — Progress Notes (Signed)
Monica Pratt May 09, 1991 [id number]    History:    Presents for annual exam.  Monthly cycles/not sexually active. Treated for Trichomonas 01/2015, negative GC/Chlamydia. Gardasil completed. Dermoid cyst removed in [facility] 10/2014. Desiring IUD.  Past medical history, past surgical history, family history and social history were all reviewed and documented in the EPIC chart. Lives in [place], Health visitor. Monica Pratt doing well.  ROS:  A ROS was performed and pertinent positives and negatives are included.  Exam:  Filed Vitals:   03/01/15 1208  BP: 115/76    General appearance:  Normal Thyroid:  Symmetrical, normal in size, without palpable masses or nodularity. Respiratory  Auscultation:  Clear without wheezing or rhonchi Cardiovascular  Auscultation:  Regular rate, without rubs, murmurs or gallops  Edema/varicosities:  Not grossly evident Abdominal  Soft,nontender, without masses, guarding or rebound.  Liver/spleen:  No organomegaly noted  Hernia:  None appreciated  Skin  Inspection:  Grossly normal   Breasts: Examined lying and sitting.     Right: Without masses, retractions, discharge or axillary adenopathy.     Left: Without masses, retractions, discharge or axillary adenopathy. Gentitourinary   Inguinal/mons:  Normal without inguinal adenopathy  External genitalia:  Normal  BUS/Urethra/Skene's glands:  Normal  Vagina:  White discharge, wet prep positive for amines, clues, TNTC bacteria and yeast  Cervix:  Normal  Uterus:   normal in size, shape and contour.  Midline and mobile  Adnexa/parametria:     Rt: Without masses or tenderness.   Lt: Without masses or tenderness.  Anus and perineum: Normal  Digital rectal exam: Normal sphincter tone without palpated masses or tenderness  Assessment/Plan:  24 y.o. SWF G1P1 for annual exam.    Bacteria vaginosis Yeast vaginitis STD screen Contraception management 10/2014 right dermoid cyst removed  Plan: Flagyl 500 twice daily  for 7 days, prescription, proper use, alcohol precautions reviewed. Diflucan 150 by mouth 1 dose prescription, proper use given and reviewed. Instructed to call if no relief of discharge. Contraception options reviewed will try Mirena IUD, will check coverage and have Dr. Phineas Pratt place with next cycle. Risks of infection, perforation or hemorrhage reviewed. SBE's, exercise, calcium rich diet, MVI daily encouraged. GC/Chlamydia culture negative, HIV, hep B, C, RPR, UA. Pap new screening guidelines reviewed.  Monica Pratt WHNP, 1:22 PM Pratt/09/2015

## 2015-03-02 ENCOUNTER — Telehealth: Payer: Self-pay | Admitting: *Deleted

## 2015-03-02 DIAGNOSIS — N898 Other specified noninflammatory disorders of vagina: Secondary | ICD-10-CM

## 2015-03-02 LAB — URINALYSIS W MICROSCOPIC + REFLEX CULTURE
Bilirubin Urine: NEGATIVE
CASTS: NONE SEEN
CRYSTALS: NONE SEEN
GLUCOSE, UA: NEGATIVE mg/dL
KETONES UR: NEGATIVE mg/dL
LEUKOCYTES UA: NEGATIVE
Nitrite: NEGATIVE
PH: 7 (ref 5.0–8.0)
Protein, ur: NEGATIVE mg/dL
SPECIFIC GRAVITY, URINE: 1.02 (ref 1.005–1.030)
Urobilinogen, UA: 0.2 mg/dL (ref 0.0–1.0)

## 2015-03-02 LAB — RPR

## 2015-03-02 LAB — HIV ANTIBODY (ROUTINE TESTING W REFLEX): HIV 1&2 Ab, 4th Generation: NONREACTIVE

## 2015-03-02 LAB — HEPATITIS C ANTIBODY: HCV AB: NEGATIVE

## 2015-03-02 LAB — HEPATITIS B SURFACE ANTIGEN: HEP B S AG: NEGATIVE

## 2015-03-02 MED ORDER — METRONIDAZOLE 500 MG PO TABS: 500.0000 mg | ORAL_TABLET | Freq: Two times a day (BID) | ORAL | Status: DC

## 2015-03-02 MED ORDER — FLUCONAZOLE 150 MG PO TABS: 150.0000 mg | ORAL_TABLET | Freq: Once | ORAL | Status: DC

## 2015-03-02 NOTE — Telephone Encounter (Signed)
Pt called requesting both Rx for OV on 03/01/15 sent to a pharmacy in [place]. This was done for Flagyl 500 twice daily for 7 days,Diflucan 150 by mouth 1

## 2015-03-04 LAB — URINE CULTURE: Colony Count: >=100000

## 2015-03-05 ENCOUNTER — Telehealth: Payer: Self-pay

## 2015-03-05 ENCOUNTER — Other Ambulatory Visit: Payer: Self-pay | Admitting: Gynecology

## 2015-03-05 DIAGNOSIS — Z30431 Encounter for routine checking of intrauterine contraceptive device: Secondary | ICD-10-CM

## 2015-03-05 LAB — CYTOLOGY - PAP

## 2015-03-05 MED ORDER — LEVONORGESTREL 20 MCG/24HR IU IUD: INTRAUTERINE_SYSTEM | Freq: Once | INTRAUTERINE | Status: DC

## 2015-03-05 NOTE — Telephone Encounter (Signed)
Yes, please call and review culture pos for uti, septra bid for 3 days #6.  thanks

## 2015-03-05 NOTE — Telephone Encounter (Signed)
Patient called for results. Informed. STD testing neg. Pap normal. I did let her know that urine culture looked like she has UTI and you would likely want to treat her and I will be calling her again to let her know about that.

## 2015-03-06 ENCOUNTER — Other Ambulatory Visit: Payer: Self-pay | Admitting: Women's Health

## 2015-03-06 DIAGNOSIS — N3000 Acute cystitis without hematuria: Secondary | ICD-10-CM

## 2015-03-06 MED ORDER — SULFAMETHOXAZOLE-TRIMETHOPRIM 800-160 MG PO TABS: 1.0000 | ORAL_TABLET | Freq: Two times a day (BID) | ORAL | Status: DC

## 2015-03-06 NOTE — Telephone Encounter (Signed)
I called patient and informed her regarding UTI.  Rx sent.

## 2015-03-14 IMAGING — CT CT ABD-PELV W/ CM
2 of 5 series · 17 of 46 positions shown, 19 images · IV contrast (OMNIPAQUE)
Comparison: None.

CLINICAL DATA: Bilateral ovarian cysts on outside hospital
ultrasound

EXAM:
CT ABDOMEN AND PELVIS WITH CONTRAST
TECHNIQUE: Multidetector CT imaging of the abdomen and pelvis was performed
using the standard protocol following bolus administration of
intravenous contrast.
CONTRAST:  100mL OMNIPAQUE IOHEXOL 300 MG/ML  SOLN

[Series 2: rtn a/p with · axial · 0.65mm/px · z∈[-489,-89]mm · 14 of 91 slices shown, 16 images]
[im 6/91  soft-tissue]
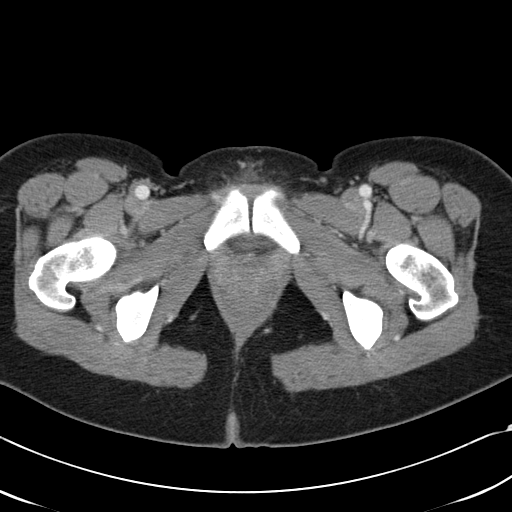
[im 6/91  bone]
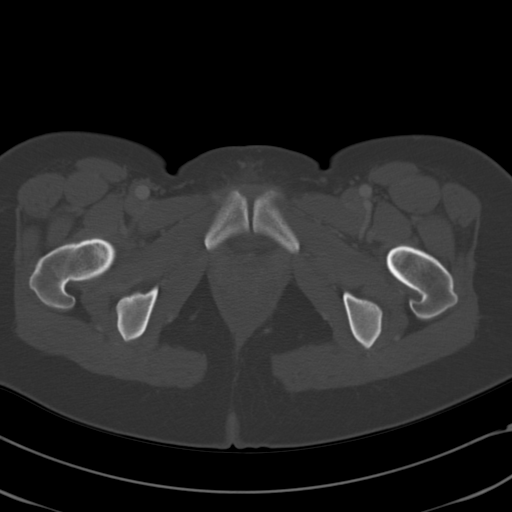
[im 11/91  soft-tissue]
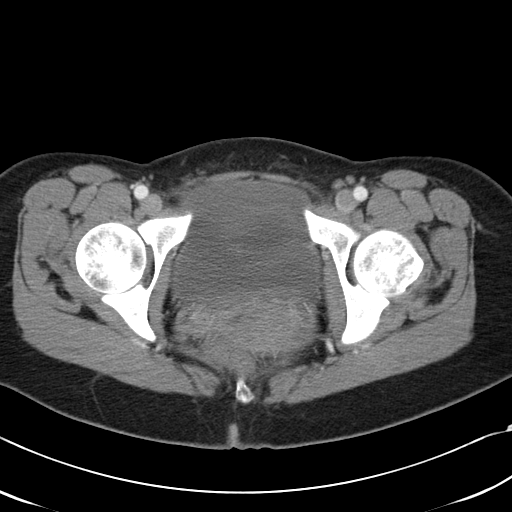
[im 21/91  soft-tissue]
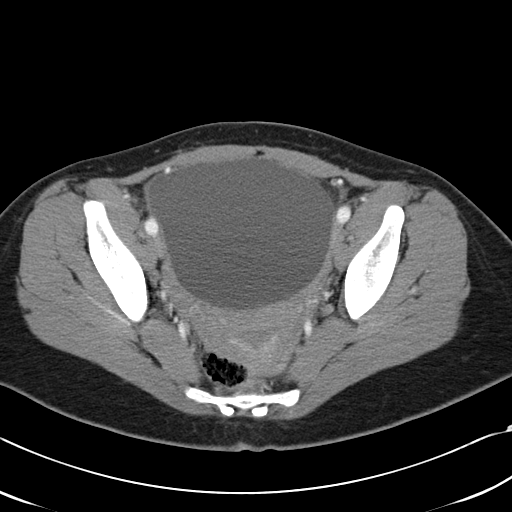
[im 26/91  soft-tissue]
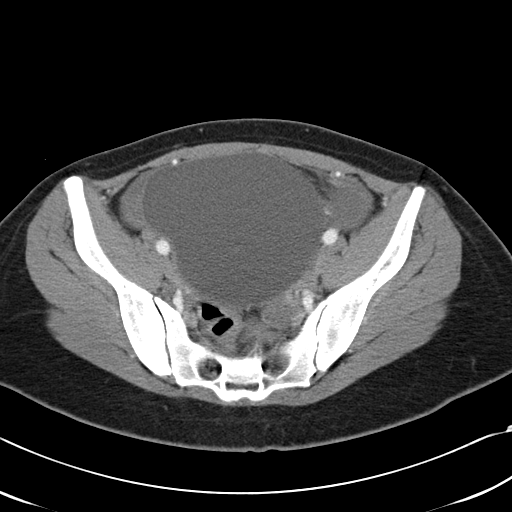
[im 31/91  soft-tissue]
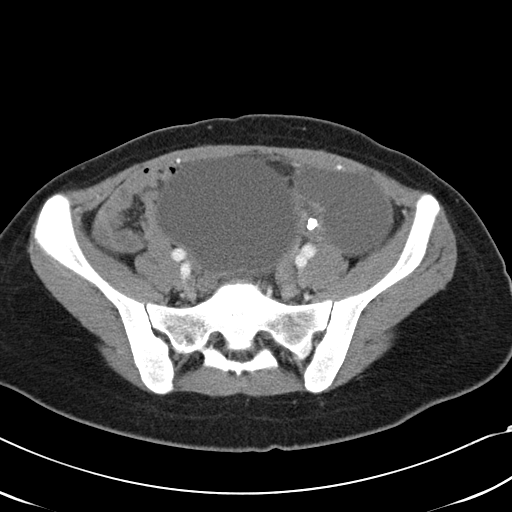
[im 36/91  soft-tissue]
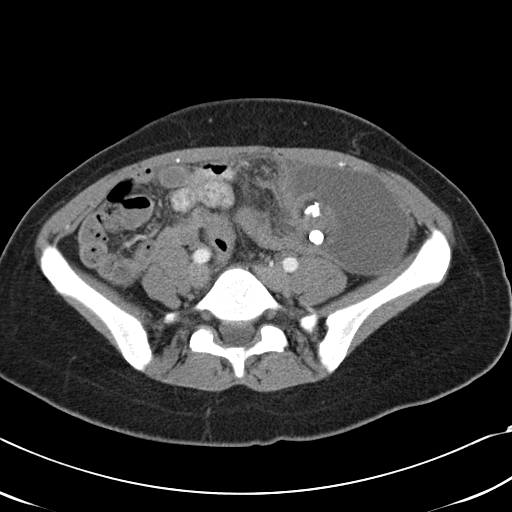
[im 41/91  soft-tissue]
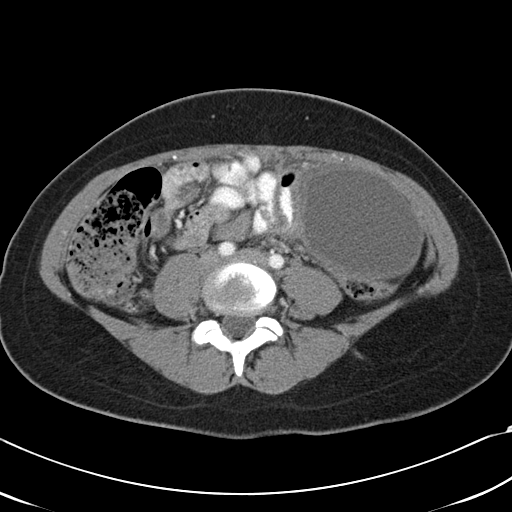
[im 51/91  soft-tissue]
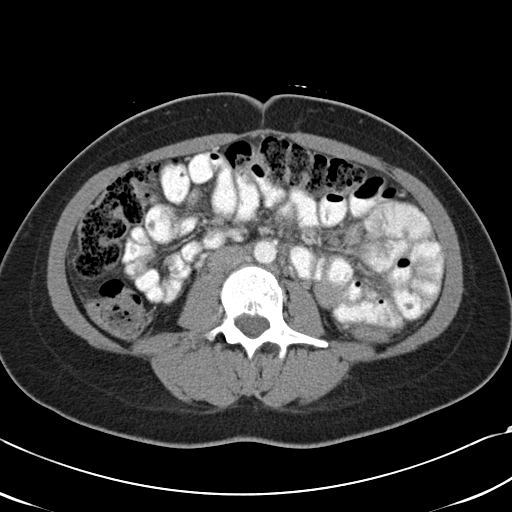
[im 56/91  soft-tissue]
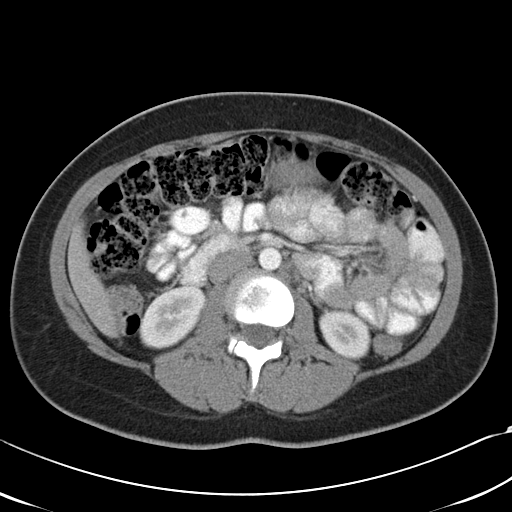
[im 56/91  bone]
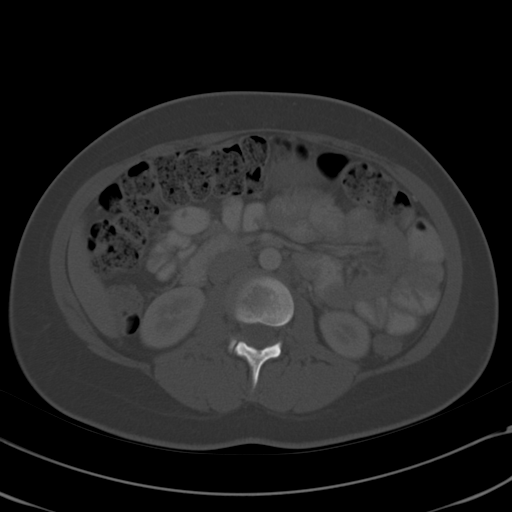
[im 61/91  soft-tissue]
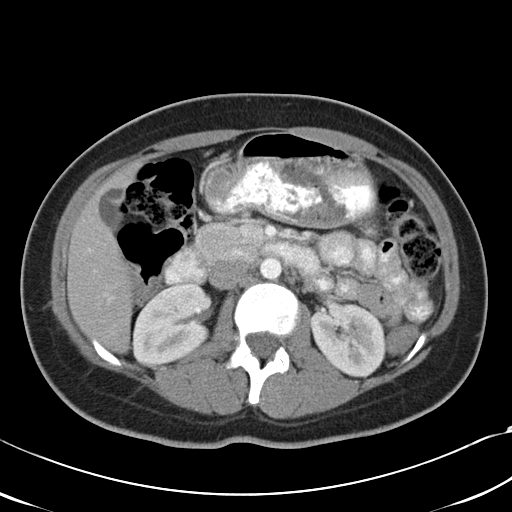
[im 66/91  soft-tissue]
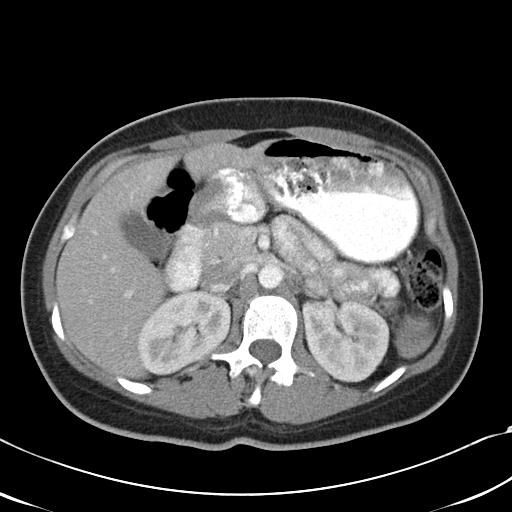
[im 71/91  soft-tissue]
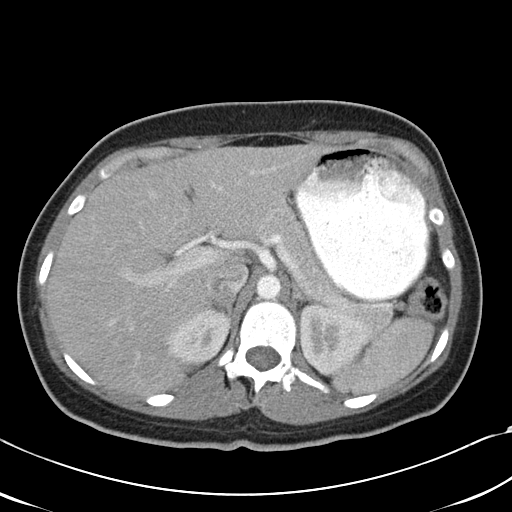
[im 81/91  soft-tissue]
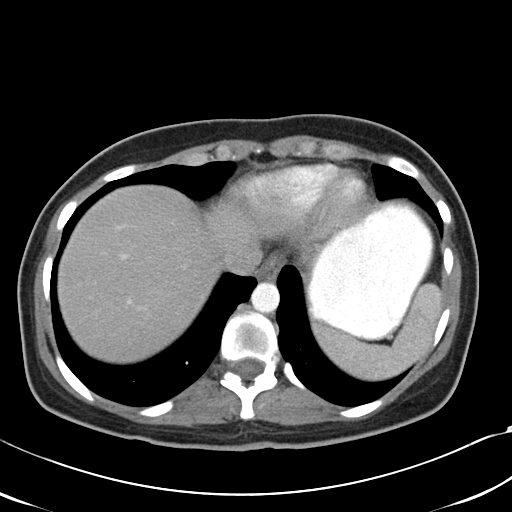
[im 86/91  soft-tissue]
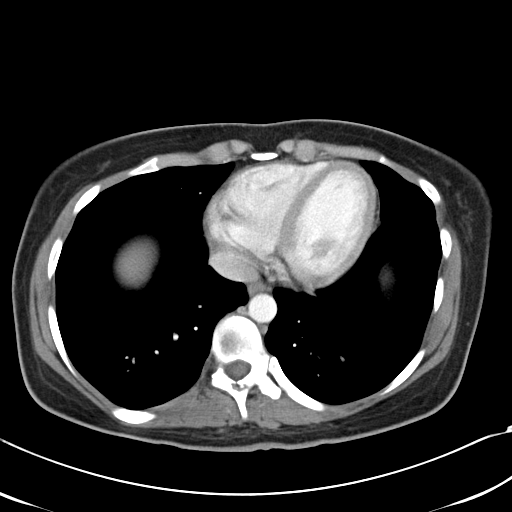

[Series 602: <mpr thick range> · coronal · 0.88mm/px · 3 of 79 slices shown]
[im 27/79  soft-tissue]
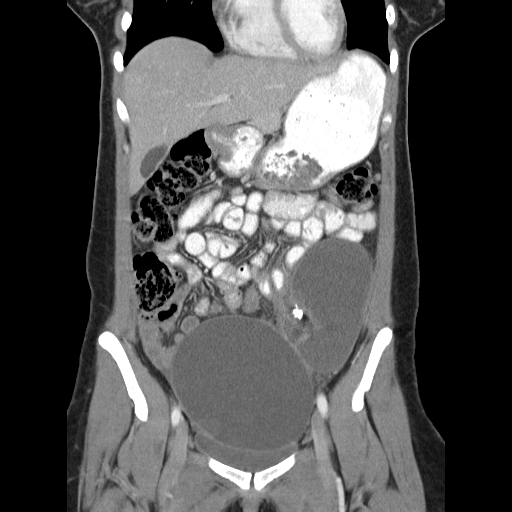
[im 35/79  soft-tissue]
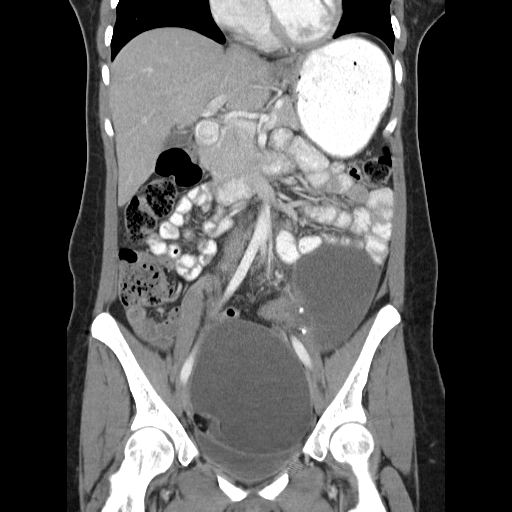
[im 44/79  soft-tissue]
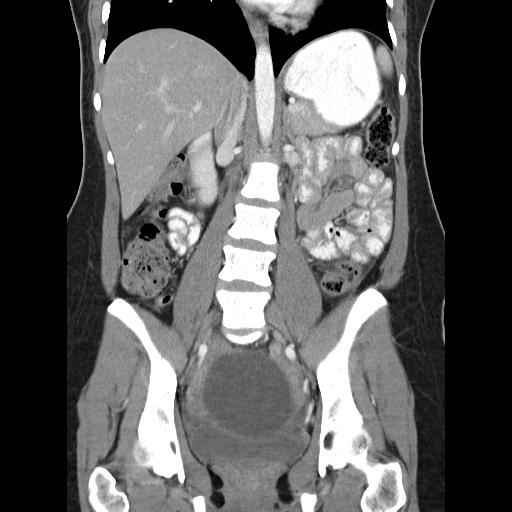

[17 of 46 positions shown; findings below may reference images not displayed]

FINDINGS: Lower chest:  Lung bases are clear.

Hepatobiliary: Liver is within normal limits.

Gallbladder is unremarkable. No intrahepatic or extrahepatic ductal
dilatation.

Pancreas: Within normal limits.

Spleen: Within normal limits.

Adrenals/Urinary Tract: Adrenal glands are unremarkable.

Kidneys are within normal limits.

Bladder is within normal limits.

Stomach/Bowel: Stomach is unremarkable.

No evidence of bowel obstruction.

Normal appendix.

Vascular/Lymphatic: No evidence of abdominal aortic aneurysm.

No suspicious abdominopelvic lymphadenopathy.

Reproductive: Uterus is notable for mildly heterogeneous
enhancement, favored to reflect a 2.1 cm intramural right uterine
body fibroid (series 2/image 33).

6.2 x 8.9 x 12.0 cm left adnexal mass with fluid, fat, and
calcifications, compatible with a left ovarian dermoid.

9.7 x 12.5 x 12.2 cm right adnexal mass with fluid, fat, and
calcifications, compatible with a right ovarian dermoid.

Other: Trace pelvic ascites.

Musculoskeletal: Visualized osseous structures are within normal
limits.
IMPRESSION: 12.5 cm right ovarian dermoid.

12.0 cm left ovarian dermoid.

Suspected 2.1 cm intramural right uterine body fibroid.

## 2015-04-19 ENCOUNTER — Telehealth: Payer: Self-pay | Admitting: Gynecology

## 2015-04-19 NOTE — Telephone Encounter (Signed)
04/19/15-I LM VM for patient that her [facility] insurance covers the Mirena & insertion for contraception at 100%, no copay. She also has Medicaid Family Planning as her secondary ins which also covers it. Patient advised TF to do insertion while she is on cycle. She will call to schedule if she wants to proceed.wl

## 2015-04-24 ENCOUNTER — Ambulatory Visit: Payer: BLUE CROSS/BLUE SHIELD | Admitting: Women's Health

## 2015-04-25 ENCOUNTER — Encounter: Payer: Self-pay | Admitting: Women's Health

## 2015-04-25 ENCOUNTER — Ambulatory Visit (INDEPENDENT_AMBULATORY_CARE_PROVIDER_SITE_OTHER): Payer: BLUE CROSS/BLUE SHIELD | Admitting: Women's Health

## 2015-04-25 VITALS — BP 115/80 | Ht 69.0 in | Wt 174.0 lb

## 2015-04-25 DIAGNOSIS — B3731 Acute candidiasis of vulva and vagina: Secondary | ICD-10-CM

## 2015-04-25 DIAGNOSIS — N926 Irregular menstruation, unspecified: Secondary | ICD-10-CM | POA: Diagnosis not present

## 2015-04-25 DIAGNOSIS — B373 Candidiasis of vulva and vagina: Secondary | ICD-10-CM

## 2015-04-25 DIAGNOSIS — Z113 Encounter for screening for infections with a predominantly sexual mode of transmission: Secondary | ICD-10-CM

## 2015-04-25 LAB — WET PREP FOR TRICH, YEAST, CLUE
Clue Cells Wet Prep HPF POC: NONE SEEN
Trich, Wet Prep: NONE SEEN

## 2015-04-25 LAB — TSH: TSH: 1.063 u[IU]/mL (ref 0.350–4.500)

## 2015-04-25 MED ORDER — MEDROXYPROGESTERONE ACETATE 10 MG PO TABS: 10.0000 mg | ORAL_TABLET | Freq: Every day | ORAL | Status: DC

## 2015-04-25 MED ORDER — FLUCONAZOLE 150 MG PO TABS: 150.0000 mg | ORAL_TABLET | Freq: Once | ORAL | Status: DC

## 2015-04-25 NOTE — Patient Instructions (Signed)

## 2015-04-25 NOTE — Progress Notes (Signed)
Patient ID: Monica Pratt, female   DOB: February 01, 1991, 24 y.o.   MRN: [id number] Presents with complaint of irregular bleeding for the past 3 weeks. Started brown spotting April 23 for a few days, (cycle early) April 25 red bleeding with clots with minimal cramping,  several days no bleeding but most days menses type blood, changing tampon every 2-3 hours. New partner/condoms. Denies fever, urinary symptoms. Contemplating  IUD, plans to do after returning from [place] where she is donating eggs.  Exam: Appears well. Abdomen soft nontender, external genitalia erythematous, speculum exam moderate amount of menses type blood noted. wet prep positive for yeast, GC/Chlamydia culture taken. Bimanual uterus small nontender no adnexal fullness or tenderness.  Yeast vaginitis Irregular cycle STD screen  Plan: 1.Diflucan 150 by mouth 1 dose with refill. 2.Qualitative hCG, if negative Provera 10 for 10 days. TSH,  Prolactin.  3.GC/Chlamydia, HIV, hep B, C, RPR. Reviewed importance of continued condoms if sexually active. Keep menstrual record, call if continued problems.

## 2015-04-26 LAB — HEPATITIS C ANTIBODY: HCV Ab: NEGATIVE

## 2015-04-26 LAB — PROLACTIN: Prolactin: 6.5 ng/mL

## 2015-04-26 LAB — RPR

## 2015-04-26 LAB — HIV ANTIBODY (ROUTINE TESTING W REFLEX): HIV 1&2 Ab, 4th Generation: NONREACTIVE

## 2015-04-26 LAB — HEPATITIS B SURFACE ANTIGEN: HEP B S AG: NEGATIVE

## 2015-04-26 LAB — HCG, SERUM, QUALITATIVE: PREG SERUM: NEGATIVE

## 2015-04-27 LAB — GC/CHLAMYDIA PROBE AMP
CT Probe RNA: NEGATIVE
GC Probe RNA: NEGATIVE

## 2015-05-23 ENCOUNTER — Encounter: Payer: Self-pay | Admitting: Women's Health

## 2015-05-23 ENCOUNTER — Ambulatory Visit (INDEPENDENT_AMBULATORY_CARE_PROVIDER_SITE_OTHER): Payer: BLUE CROSS/BLUE SHIELD | Admitting: Women's Health

## 2015-05-23 VITALS — BP 115/80 | Ht 69.0 in | Wt 173.0 lb

## 2015-05-23 DIAGNOSIS — N898 Other specified noninflammatory disorders of vagina: Secondary | ICD-10-CM

## 2015-05-23 DIAGNOSIS — B9689 Other specified bacterial agents as the cause of diseases classified elsewhere: Secondary | ICD-10-CM

## 2015-05-23 DIAGNOSIS — B373 Candidiasis of vulva and vagina: Secondary | ICD-10-CM | POA: Diagnosis not present

## 2015-05-23 DIAGNOSIS — A499 Bacterial infection, unspecified: Secondary | ICD-10-CM | POA: Diagnosis not present

## 2015-05-23 DIAGNOSIS — N9489 Other specified conditions associated with female genital organs and menstrual cycle: Secondary | ICD-10-CM | POA: Diagnosis not present

## 2015-05-23 DIAGNOSIS — N760 Acute vaginitis: Secondary | ICD-10-CM

## 2015-05-23 DIAGNOSIS — B3731 Acute candidiasis of vulva and vagina: Secondary | ICD-10-CM

## 2015-05-23 LAB — URINALYSIS W MICROSCOPIC + REFLEX CULTURE
Bilirubin Urine: NEGATIVE
Glucose, UA: NEGATIVE mg/dL
Hgb urine dipstick: NEGATIVE
Ketones, ur: NEGATIVE mg/dL
LEUKOCYTES UA: NEGATIVE
NITRITE: NEGATIVE
Protein, ur: NEGATIVE mg/dL
SPECIFIC GRAVITY, URINE: 1.02 (ref 1.005–1.030)
Urobilinogen, UA: 0.2 mg/dL (ref 0.0–1.0)
pH: 7 (ref 5.0–8.0)

## 2015-05-23 LAB — WET PREP FOR TRICH, YEAST, CLUE: Trich, Wet Prep: NONE SEEN

## 2015-05-23 MED ORDER — METRONIDAZOLE 0.75 % VA GEL: VAGINAL | Status: DC

## 2015-05-23 MED ORDER — FLUCONAZOLE 150 MG PO TABS: 150.0000 mg | ORAL_TABLET | Freq: Once | ORAL | Status: DC

## 2015-05-23 NOTE — Patient Instructions (Signed)
Monilial Vaginitis Vaginitis in a soreness, swelling and redness (inflammation) of the vagina and vulva. Monilial vaginitis is not a sexually transmitted infection. CAUSES  Yeast vaginitis is caused by yeast (candida) that is normally found in your vagina. With a yeast infection, the candida has overgrown in number to a point that upsets the chemical balance. SYMPTOMS   White, thick vaginal discharge.  Swelling, itching, redness and irritation of the vagina and possibly the lips of the vagina (vulva).  Burning or painful urination.  Painful intercourse. DIAGNOSIS  Things that may contribute to monilial vaginitis are:  Postmenopausal and virginal states.  Pregnancy.  Infections.  Being tired, sick or stressed, especially if you had monilial vaginitis in the past.  Diabetes. Good control will help lower the chance.  Birth control pills.  Tight fitting garments.  Using bubble bath, feminine sprays, douches or deodorant tampons.  Taking certain medications that kill germs (antibiotics).  Sporadic recurrence can occur if you become ill. TREATMENT  Your caregiver will give you medication.  There are several kinds of anti monilial vaginal creams and suppositories specific for monilial vaginitis. For recurrent yeast infections, use a suppository or cream in the vagina 2 times a week, or as directed.  Anti-monilial or steroid cream for the itching or irritation of the vulva may also be used. Get your caregiver's permission.  Painting the vagina with methylene blue solution may help if the monilial cream does not work.  Eating yogurt may help prevent monilial vaginitis. HOME CARE INSTRUCTIONS   Finish all medication as prescribed.  Do not have sex until treatment is completed or after your caregiver tells you it is okay.  Take warm sitz baths.  Do not douche.  Do not use tampons, especially scented ones.  Wear cotton underwear.  Avoid tight pants and panty  hose.  Tell your sexual partner that you have a yeast infection. They should go to their caregiver if they have symptoms such as mild rash or itching.  Your sexual partner should be treated as well if your infection is difficult to eliminate.  Practice safer sex. Use condoms.  Some vaginal medications cause latex condoms to fail. Vaginal medications that harm condoms are:  Cleocin cream.  Butoconazole (Femstat).  Terconazole (Terazol) vaginal suppository.  Miconazole (Monistat) (may be purchased over the counter). SEEK MEDICAL CARE IF:   You have a temperature by mouth above 102 F (38.9 C).  The infection is getting worse after 2 days of treatment.  The infection is not getting better after 3 days of treatment.  You develop blisters in or around your vagina.  You develop vaginal bleeding, and it is not your menstrual period.  You have pain when you urinate.  You develop intestinal problems.  You have pain with sexual intercourse. Document Released: 09/17/2005 Document Revised: 03/01/2012 Document Reviewed: 06/01/2009 [facility] Patient Information 2015 [place], [place]. This information is not intended to replace advice given to you by your health care provider. Make sure you discuss any questions you have with your health care provider. Bacterial Vaginosis Bacterial vaginosis is a vaginal infection that occurs when the normal balance of bacteria in the vagina is disrupted. It results from an overgrowth of certain bacteria. This is the most common vaginal infection in women of childbearing age. Treatment is important to prevent complications, especially in pregnant women, as it can cause a premature delivery. CAUSES  Bacterial vaginosis is caused by an increase in harmful bacteria that are normally present in smaller amounts in the  vagina. Several different kinds of bacteria can cause bacterial vaginosis. However, the reason that the condition develops is not fully  understood. RISK FACTORS Certain activities or behaviors can put you at an increased risk of developing bacterial vaginosis, including:  Having a new sex partner or multiple sex partners.  Douching.  Using an intrauterine device (IUD) for contraception. Women do not get bacterial vaginosis from toilet seats, bedding, swimming pools, or contact with objects around them. SIGNS AND SYMPTOMS  Some women with bacterial vaginosis have no signs or symptoms. Common symptoms include:  Grey vaginal discharge.  A fishlike odor with discharge, especially after sexual intercourse.  Itching or burning of the vagina and vulva.  Burning or pain with urination. DIAGNOSIS  Your health care provider will take a medical history and examine the vagina for signs of bacterial vaginosis. A sample of vaginal fluid may be taken. Your health care provider will look at this sample under a microscope to check for bacteria and abnormal cells. A vaginal pH test may also be done.  TREATMENT  Bacterial vaginosis may be treated with antibiotic medicines. These may be given in the form of a pill or a vaginal cream. A second round of antibiotics may be prescribed if the condition comes back after treatment.  HOME CARE INSTRUCTIONS   Only take over-the-counter or prescription medicines as directed by your health care provider.  If antibiotic medicine was prescribed, take it as directed. Make sure you finish it even if you start to feel better.  Do not have sex until treatment is completed.  Tell all sexual partners that you have a vaginal infection. They should see their health care provider and be treated if they have problems, such as a mild rash or itching.  Practice safe sex by using condoms and only having one sex partner. SEEK MEDICAL CARE IF:   Your symptoms are not improving after 3 days of treatment.  You have increased discharge or pain.  You have a fever. MAKE SURE YOU:   Understand these  instructions.  Will watch your condition.  Will get help right away if you are not doing well or get worse. FOR MORE INFORMATION  Centers for Disease Control and Prevention, Division of STD Prevention: AppraiserFraud.fi American Sexual Health Association (ASHA): www.ashastd.org  Document Released: 12/08/2005 Document Revised: 09/28/2013 Document Reviewed: 07/20/2013 [facility] Patient Information 2015 [place], [place]. This information is not intended to replace advice given to you by your health care provider. Make sure you discuss any questions you have with your health care provider.

## 2015-05-23 NOTE — Progress Notes (Signed)
Patient ID: Monica Pratt, female   DOB: 1991-04-09, 24 y.o.   MRN: [id number] Presents with complaint of increased vaginal discharge with itching and odor. Has had problems with recurrent yeast and BV. Negative STD screen with current partner. Denies abdominal pain, fever or urinary symptoms. Monthly cycle/condoms. Currently in the process of donating eggs in [place] process expected to be completed in July and then would like to proceed with Mirena IUD.  Exam: Appears well. External genitalia mild erythema, speculum exam moderate amount of an milky adherent discharge with odor noted, wet prep positive for yeast, amines, clues, TNTC bacteria. Bimanual uterus small nontender.  Recurrent BV and Yeast vaginitis  Plan: MetroGel vaginal cream 1 applicator at bedtime 5, alcohol precautions reviewed, Diflucan 150 by mouth 1 dose prescription, proper use for both given and reviewed. After symptoms resolve boric acid gelcaps 600 mg twice weekly. Instructed to call if no relief or continued problems. Call office in July with cycle for Dr. Phineas Real to place Mirena.

## 2015-05-28 ENCOUNTER — Telehealth: Payer: Self-pay

## 2015-05-28 NOTE — Telephone Encounter (Signed)
Patient was in last weds dx's with BV.  She asks what her ur cult showed because she thinks she might have uti now. I told her no culture done because u/a was perfectly normal.  She said itchiness and other sx have resolved but "really uncomfortable in my lower abdomen, crampy uncomfortable". She said yesterday began with some urgency and stinging.   I explained [place] off today but one of the MD's could see her to check u/a and see her to discuss treatment. She declined stating she lives in [place]. She asked when [place] will be back and I told her [place] back tomorrow. She said she works til 2 each day and asked regarding [place] would have.  I transferred her to Rosemarie Ax to discuss appt.

## 2015-05-28 NOTE — Telephone Encounter (Signed)
Message left

## 2015-05-29 NOTE — Telephone Encounter (Signed)
Message left

## 2015-05-30 NOTE — Telephone Encounter (Signed)
Telephone call, urinary symptoms have resolved. 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

## 2017-05-06 ENCOUNTER — Encounter: Payer: Self-pay | Admitting: Gynecology

## 2018-05-04 DIAGNOSIS — F411 Generalized anxiety disorder: Secondary | ICD-10-CM | POA: Diagnosis not present

## 2018-05-10 DIAGNOSIS — F411 Generalized anxiety disorder: Secondary | ICD-10-CM | POA: Diagnosis not present

## 2018-05-19 ENCOUNTER — Ambulatory Visit: Payer: BLUE CROSS/BLUE SHIELD | Admitting: Women's Health

## 2018-05-26 ENCOUNTER — Ambulatory Visit: Payer: BLUE CROSS/BLUE SHIELD | Admitting: Gynecology

## 2018-05-28 ENCOUNTER — Encounter: Payer: Self-pay | Admitting: Gynecology

## 2018-05-28 ENCOUNTER — Ambulatory Visit: Payer: BLUE CROSS/BLUE SHIELD | Admitting: Gynecology

## 2018-05-28 VITALS — BP 118/74 | Ht 69.0 in | Wt 171.0 lb

## 2018-05-28 DIAGNOSIS — R1032 Left lower quadrant pain: Secondary | ICD-10-CM | POA: Diagnosis not present

## 2018-05-28 NOTE — Patient Instructions (Signed)
Follow up for ultrasound as scheduled 

## 2018-05-28 NOTE — Progress Notes (Signed)
    Monica Pratt Apr 28, 1991 [id number]        26 y.o.  G1P0011 presents complaining of left lower quadrant/pelvic pain starting approximately a month or so ago with her menses.  Has continued since then.  Aching to stabbing on a daily basis.  Is having some issues with constipation currently taking MiraLAX.  No diarrhea.  Some urinary frequency but no dysuria urgency fever or chills.  Having regular monthly menses.  Not using contraception and not sexually active.  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  She is concerned that she was redeveloping an ovarian cyst causing her pain.  Past medical history,surgical history, problem list, medications, allergies, family history and social history were all reviewed and documented in the EPIC chart.  Directed ROS with pertinent positives and negatives documented in the history of present illness/assessment and plan.  Exam: Caryn Bee assistant Vitals:   05/28/18 0902  BP: 118/74  Weight: 171 lb (77.6 kg)  Height: 5\' 9"  (1.753 m)   General appearance:  Normal Abdomen soft nontender without masses guarding rebound Pelvic external BUS vagina normal.  Cervix normal.  Uterus grossly normal size midline mobile nontender.  Adnexa without masses or tenderness.  Rectal exam normal.  Assessment/Plan:  27 y.o. G1P0011 with left lower quadrant pain over the last several weeks.  Is having issues with constipation.  Physical exam is normal.  History of bilateral ovarian mature cystic teratomas.  Recommend daily FiberCon/Metamucil to try to alleviate her constipation which may be the cause of her pain.  Check GYN ultrasound to rule out ovarian process with her history of bilateral dermoids.  Patient will follow-up for the ultrasound and then will go from there.  She has her annual exam scheduled next month.    Anastasio Auerbach MD, 9:22 AM 05/28/2018

## 2018-05-29 LAB — NO CULTURE INDICATED

## 2018-05-29 LAB — URINALYSIS, COMPLETE W/RFL CULTURE
BILIRUBIN URINE: NEGATIVE
Bacteria, UA: NONE SEEN /HPF
Glucose, UA: NEGATIVE
Hgb urine dipstick: NEGATIVE
Hyaline Cast: NONE SEEN /LPF
Ketones, ur: NEGATIVE
LEUKOCYTE ESTERASE: NEGATIVE
Nitrites, Initial: NEGATIVE
Protein, ur: NEGATIVE
RBC / HPF: NONE SEEN /HPF (ref 0–2)
SPECIFIC GRAVITY, URINE: 1.023 (ref 1.001–1.03)
WBC, UA: NONE SEEN /HPF (ref 0–5)
pH: 7.5 (ref 5.0–8.0)

## 2018-06-14 ENCOUNTER — Ambulatory Visit (INDEPENDENT_AMBULATORY_CARE_PROVIDER_SITE_OTHER): Payer: BLUE CROSS/BLUE SHIELD | Admitting: Gynecology

## 2018-06-14 ENCOUNTER — Encounter: Payer: Self-pay | Admitting: Gynecology

## 2018-06-14 ENCOUNTER — Ambulatory Visit (INDEPENDENT_AMBULATORY_CARE_PROVIDER_SITE_OTHER): Payer: BLUE CROSS/BLUE SHIELD

## 2018-06-14 VITALS — BP 118/76

## 2018-06-14 DIAGNOSIS — R1032 Left lower quadrant pain: Secondary | ICD-10-CM | POA: Diagnosis not present

## 2018-06-14 NOTE — Progress Notes (Signed)
    Monica Pratt 12-22-1991 [id number]        26 y.o.  G1P0011 presents for ultrasound.  Patient was having left lower quadrant pelvic discomfort over the last month or so.  Has been coming and going on and off.  History of bilateral dermoids status post exploratory laparotomy 2015.  Past medical history,surgical history, problem list, medications, allergies, family history and social history were all reviewed and documented in the EPIC chart.  Directed ROS with pertinent positives and negatives documented in the history of present illness/assessment and plan.  Exam: Vitals:   06/14/18 1447  BP: 118/76   General appearance:  Normal  Ultrasound transvaginal shows uterus normal size and echotexture.  Endometrial echo 11.9 mm, tri layered in appearance.  Right and left ovaries normal with physiologic changes.  Cul-de-sac with small amount of fluid 20 x 11 x 17 mm.  Assessment/Plan:  27 y.o. G1P0011 with lower abdominal/pelvic discomfort that I think is probable more GI and possibly constipation related.  Ultrasound is normal with no evidence of ovarian pathology to include dermoids.  Patient is going to get more aggressive as far as having more regular bowel movements and will see if this does not help to resolve her discomfort.  Patient will follow-up next month at her scheduled annual exam.    Anastasio Auerbach MD, 3:08 PM 06/14/2018

## 2018-06-14 NOTE — Patient Instructions (Signed)
Follow-up at your scheduled annual exam appointment

## 2018-06-21 DIAGNOSIS — R87612 Low grade squamous intraepithelial lesion on cytologic smear of cervix (LGSIL): Secondary | ICD-10-CM

## 2018-06-21 HISTORY — DX: Low grade squamous intraepithelial lesion on cytologic smear of cervix (LGSIL): R87.612

## 2018-07-05 ENCOUNTER — Ambulatory Visit (INDEPENDENT_AMBULATORY_CARE_PROVIDER_SITE_OTHER): Payer: BLUE CROSS/BLUE SHIELD | Admitting: Women's Health

## 2018-07-05 ENCOUNTER — Encounter: Payer: Self-pay | Admitting: Women's Health

## 2018-07-05 VITALS — BP 104/70 | Ht 69.0 in | Wt 169.0 lb

## 2018-07-05 DIAGNOSIS — R87612 Low grade squamous intraepithelial lesion on cytologic smear of cervix (LGSIL): Secondary | ICD-10-CM | POA: Diagnosis not present

## 2018-07-05 DIAGNOSIS — B9689 Other specified bacterial agents as the cause of diseases classified elsewhere: Secondary | ICD-10-CM | POA: Diagnosis not present

## 2018-07-05 DIAGNOSIS — Z01419 Encounter for gynecological examination (general) (routine) without abnormal findings: Secondary | ICD-10-CM | POA: Diagnosis not present

## 2018-07-05 DIAGNOSIS — N760 Acute vaginitis: Secondary | ICD-10-CM | POA: Diagnosis not present

## 2018-07-05 LAB — CBC WITH DIFFERENTIAL/PLATELET
Basophils Absolute: 47 cells/uL (ref 0–200)
Basophils Relative: 0.7 %
EOS ABS: 161 {cells}/uL (ref 15–500)
Eosinophils Relative: 2.4 %
HCT: 37.6 % (ref 35.0–45.0)
Hemoglobin: 12.6 g/dL (ref 11.7–15.5)
Lymphs Abs: 2204 cells/uL (ref 850–3900)
MCH: 31.7 pg (ref 27.0–33.0)
MCHC: 33.5 g/dL (ref 32.0–36.0)
MCV: 94.5 fL (ref 80.0–100.0)
MPV: 10 fL (ref 7.5–12.5)
Monocytes Relative: 6.5 %
Neutro Abs: 3853 cells/uL (ref 1500–7800)
Neutrophils Relative %: 57.5 %
PLATELETS: 322 10*3/uL (ref 140–400)
RBC: 3.98 10*6/uL (ref 3.80–5.10)
RDW: 12 % (ref 11.0–15.0)
TOTAL LYMPHOCYTE: 32.9 %
WBC mixed population: 436 cells/uL (ref 200–950)
WBC: 6.7 10*3/uL (ref 3.8–10.8)

## 2018-07-05 LAB — WET PREP FOR TRICH, YEAST, CLUE

## 2018-07-05 MED ORDER — METRONIDAZOLE 0.75 % VA GEL: VAGINAL | 1 refills | Status: DC

## 2018-07-05 NOTE — Patient Instructions (Signed)

## 2018-07-05 NOTE — Progress Notes (Signed)
Twanisha Foulk March 08, 1991 [id number]    History:    Presents for annual exam.  Regular monthly cycle not sexually active.  Had negative STD screen since last sexual activity.  Normal Pap history.  Wellbutrin per primary care.  History of recurrent BV.  Past medical history, past surgical history, family history and social history were all reviewed and documented in the EPIC chart.  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  2015 dermoid cyst.  ROS:  A ROS was performed and pertinent positives and negatives are included.  Exam:  Vitals:   07/05/18 1625  BP: 104/70  Weight: 169 lb (76.7 kg)  Height: 5\' 9"  (1.753 m)   Body mass index is 24.96 kg/m.   General appearance:  Normal Thyroid:  Symmetrical, normal in size, without palpable masses or nodularity. Respiratory  Auscultation:  Clear without wheezing or rhonchi Cardiovascular  Auscultation:  Regular rate, without rubs, murmurs or gallops  Edema/varicosities:  Not grossly evident Abdominal  Soft,nontender, without masses, guarding or rebound.  Liver/spleen:  No organomegaly noted  Hernia:  None appreciated  Skin  Inspection:  Grossly normal   Breasts: Examined lying and sitting.     Right: Without masses, retractions, discharge or axillary adenopathy.     Left: Without masses, retractions, discharge or axillary adenopathy. Gentitourinary   Inguinal/mons:  Normal without inguinal adenopathy  External genitalia:  Normal  BUS/Urethra/Skene's glands:  Normal  Vagina: Minimal menses blood with odor, wet prep positive for clues, TNTC bacteria  Cervix:  Normal  Uterus:   normal in size, shape and contour.  Midline and mobile  Adnexa/parametria:     Rt: Without masses or tenderness.   Lt: Without masses or tenderness.  Anus and perineum: Normal    Assessment/Plan:  27 y.o. S WF G1, P1 for annual exam with no complaints.  Monthly cycle/not sexually active Depression-primary care manages Recurrent bacterial vaginosis  Plan:  MetroGel vaginal cream 1 applicator at bedtime x5, prescription, proper use, alcohol avoidance reviewed.  We will try 1 applicator monthly after cycle, instructed to call if no relief of symptoms.  SBE's, regular exercise, calcium rich foods, MVI daily encouraged.  Return to office for contraception as needed, or condoms.  CBC, Pap.    Huel Cote [facility], 5:07 PM 07/05/2018

## 2018-07-06 LAB — PAP IG W/ RFLX HPV ASCU

## 2018-07-23 ENCOUNTER — Ambulatory Visit (INDEPENDENT_AMBULATORY_CARE_PROVIDER_SITE_OTHER): Payer: BLUE CROSS/BLUE SHIELD | Admitting: Gynecology

## 2018-07-23 ENCOUNTER — Encounter: Payer: Self-pay | Admitting: Gynecology

## 2018-07-23 VITALS — BP 118/76

## 2018-07-23 DIAGNOSIS — R87612 Low grade squamous intraepithelial lesion on cytologic smear of cervix (LGSIL): Secondary | ICD-10-CM | POA: Diagnosis not present

## 2018-07-23 NOTE — Progress Notes (Signed)
    Monica Pratt Sep 04, 1991 [id number]        26 y.o.  G1P0011 presents for colposcopy.  She had her first abnormal Pap smear showing LGSIL.  Past medical history,surgical history, problem list, medications, allergies, family history and social history were all reviewed and documented in the EPIC chart.  Directed ROS with pertinent positives and negatives documented in the history of present illness/assessment and plan.  Exam: Copywriter, advertising Vitals:   07/23/18 1228  BP: 118/76   General appearance:  Normal Abdomen soft nontender without masses guarding rebound Pelvic external BUS vagina normal.  Cervix normal.  Uterus normal size midline mobile nontender.  Adnexa without masses or tenderness.  Colposcopy performed after acetic acid cleanse is adequate with transformation zone visualized 360 degrees.  A patch of acetowhite change at 12:00 off of the transformation zone noted.  Representative biopsy taken.  ECC performed.  Patient tolerated well.  Physical Exam  Genitourinary:       Assessment/Plan:  27 y.o. [id number] with first abnormal Pap smear showing LGSIL.  Colposcopy was adequate noting acetowhite area off the transformation zone at 12:00.  Representative biopsies taken.  ECC performed.  The patient and I discussed dysplasia, high-grade/low-grade, progression/regression and the HPV association.  Patient will follow-up for biopsy results.  Assuming low-grade then plan expectant management with follow-up Pap smear 1 year.  If otherwise then will triaged based upon results.    Anastasio Auerbach MD, 1:05 PM 07/23/2018

## 2018-07-23 NOTE — Patient Instructions (Signed)
Office will call you with biopsy results 

## 2018-07-23 NOTE — Addendum Note (Signed)
Addended by: Anastasio Auerbach on: 07/23/2018 01:13 PM   Modules accepted: Orders

## 2018-07-27 LAB — TISSUE PATH REPORT

## 2018-07-27 LAB — PATHOLOGY

## 2018-08-09 DIAGNOSIS — F331 Major depressive disorder, recurrent, moderate: Secondary | ICD-10-CM | POA: Diagnosis not present

## 2019-05-17 ENCOUNTER — Other Ambulatory Visit: Payer: Self-pay

## 2019-05-17 ENCOUNTER — Ambulatory Visit (INDEPENDENT_AMBULATORY_CARE_PROVIDER_SITE_OTHER): Payer: Commercial Managed Care - PPO | Admitting: Women's Health

## 2019-05-17 ENCOUNTER — Encounter: Payer: Self-pay | Admitting: Women's Health

## 2019-05-17 VITALS — BP 110/80

## 2019-05-17 DIAGNOSIS — Z113 Encounter for screening for infections with a predominantly sexual mode of transmission: Secondary | ICD-10-CM

## 2019-05-17 NOTE — Progress Notes (Signed)
27 year old S WF G1, P1 presents requesting STD screen, new partner.  Has not been sexually active in greater than 2 years.  Monthly cycle condoms consistently.  Denies abdominal/back pain, vaginal discharge, urinary symptoms, or fever.  No known medical problems.  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  Exam: Appears well.  Abdomen soft, nontender, external genitalia within normal limits, speculum exam no visible discharge, erythema, GC/chlamydia culture taken.  Bimanual no CMT or adnexal tenderness.  STD screen  Plan: GC/chlamydia culture taken.  Pending.  Pending.  HIV, RPR.  Annual exam due in July.  Contraception options reviewed and declines will continue condoms.  Plan B emergency contraception reviewed.

## 2019-05-18 LAB — C. TRACHOMATIS/N. GONORRHOEAE RNA
C. trachomatis RNA, TMA: NOT DETECTED
N. gonorrhoeae RNA, TMA: NOT DETECTED

## 2019-05-18 LAB — HIV ANTIBODY (ROUTINE TESTING W REFLEX): HIV 1&2 Ab, 4th Generation: NONREACTIVE

## 2019-05-18 LAB — RPR: RPR Ser Ql: NONREACTIVE

## 2019-09-12 ENCOUNTER — Encounter: Payer: Self-pay | Admitting: Gynecology

## 2020-03-05 ENCOUNTER — Other Ambulatory Visit: Payer: Self-pay

## 2020-03-06 ENCOUNTER — Ambulatory Visit (INDEPENDENT_AMBULATORY_CARE_PROVIDER_SITE_OTHER): Payer: Medicaid Other | Admitting: Obstetrics and Gynecology

## 2020-03-06 ENCOUNTER — Encounter: Payer: Self-pay | Admitting: Obstetrics and Gynecology

## 2020-03-06 VITALS — BP 118/76 | Ht 69.0 in | Wt 182.0 lb

## 2020-03-06 DIAGNOSIS — Z0000 Encounter for general adult medical examination without abnormal findings: Secondary | ICD-10-CM

## 2020-03-06 DIAGNOSIS — Z113 Encounter for screening for infections with a predominantly sexual mode of transmission: Secondary | ICD-10-CM

## 2020-03-06 DIAGNOSIS — Z124 Encounter for screening for malignant neoplasm of cervix: Secondary | ICD-10-CM

## 2020-03-06 DIAGNOSIS — Z1151 Encounter for screening for human papillomavirus (HPV): Secondary | ICD-10-CM

## 2020-03-06 DIAGNOSIS — Z01419 Encounter for gynecological examination (general) (routine) without abnormal findings: Secondary | ICD-10-CM

## 2020-03-06 DIAGNOSIS — Z13220 Encounter for screening for lipoid disorders: Secondary | ICD-10-CM

## 2020-03-06 NOTE — Progress Notes (Signed)
   Monica Pratt [birth year] [id number]  SUBJECTIVE:  29 y.o. G53P1011 female for annual routine gynecologic exam and Pap smear. She has no gynecologic concerns.  Slight spotting today mid cycle.  No menstrual concerns, normal and regular 28 day cycle. She does request STI screening.  No current outpatient medications on file.   No current facility-administered medications for this visit.   Allergies: Imitrex [sumatriptan]  Patient's last menstrual period was 02/22/2020.  Past medical history,surgical history, problem list, medications, allergies, family history and social history were all reviewed and documented as reviewed in the EPIC chart.  ROS:  Feeling well. No dyspnea or chest pain on exertion.  No abdominal pain, change in bowel habits, black or bloody stools.  No urinary tract symptoms. GYN ROS: normal menses, no abnormal bleeding, pelvic pain or discharge, no breast pain or new or enlarging lumps on self exam. No neurological complaints.   OBJECTIVE:  BP 118/76   Ht 5\' 9"  (1.753 m)   Wt 182 lb (82.6 kg)   LMP 02/22/2020   BMI 26.88 kg/m  The patient appears well, alert, oriented x 3, in no distress. ENT normal.  Neck supple. No cervical or supraclavicular adenopathy or thyromegaly.  Lungs are clear, good air entry, no wheezes, rhonchi or rales. S1 and S2 normal, no murmurs, regular rate and rhythm.  Abdomen soft without tenderness, guarding, mass or organomegaly.  Neurological is normal, no focal findings.  BREAST EXAM: breasts appear normal, no suspicious masses, no skin or nipple changes or axillary nodes  PELVIC EXAM: VULVA: normal appearing vulva with no masses, tenderness or lesions, VAGINA: normal appearing vagina with normal color and discharge, no lesions, CERVIX: normal appearing cervix with lesions, small amount of clear whitish watery discharge present and wiped away UTERUS: uterus is normal size, shape, consistency and nontender, ADNEXA: normal adnexa in size,  nontender and no masses, PAP: Pap smear done today, thin-prep method  Chaperone: Caryn Bee present during the examination  ASSESSMENT:  29 y.o. [id number] here for annual gynecologic exam  PLAN:   1. No menstrual or hormonal concerns.  Preovulatory spotting likely due to hormonal fluctuation, no signs of pathology on exam today. 2. History of abnormal Pap smear.  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  Pap smear/HPV test collected today. 3. Contraception: Declines need for contraception.  Condoms only at this time. 4.  STI screening requested by patient.  GC/chlamydia off of the Pap smear.  Serum screening offered and accepted by patient for HIV, hepatitis B surface antigen, hepatitis C antibody, RPR.  Aware that we are not screening for trichomonas as wet prep was not done since the request for screening came up after completion of the pelvic exam. 5. Normal breast exam.  Encouraged breast self awareness and notify us of any concerning changes. 6. Health maintenance.  Routine screening blood work (lipids, CBC, CMP) will be added onto today's blood work since she says she has not had these tests done before.  She is not fasting currently.  Return annually or sooner, prn.  Joseph Pierini MD  03/06/20

## 2020-03-07 LAB — CBC
HCT: 35.2 % (ref 35.0–45.0)
Hemoglobin: 11.8 g/dL (ref 11.7–15.5)
MCH: 32.2 pg (ref 27.0–33.0)
MCHC: 33.5 g/dL (ref 32.0–36.0)
MCV: 95.9 fL (ref 80.0–100.0)
MPV: 10.4 fL (ref 7.5–12.5)
Platelets: 339 10*3/uL (ref 140–400)
RBC: 3.67 10*6/uL — ABNORMAL LOW (ref 3.80–5.10)
RDW: 12 % (ref 11.0–15.0)
WBC: 6.2 10*3/uL (ref 3.8–10.8)

## 2020-03-07 LAB — COMPREHENSIVE METABOLIC PANEL
AG Ratio: 1.7 (calc) (ref 1.0–2.5)
ALT: 12 U/L (ref 6–29)
AST: 16 U/L (ref 10–30)
Albumin: 4 g/dL (ref 3.6–5.1)
Alkaline phosphatase (APISO): 44 U/L (ref 31–125)
BUN: 12 mg/dL (ref 7–25)
CO2: 29 mmol/L (ref 20–32)
Calcium: 9.3 mg/dL (ref 8.6–10.2)
Chloride: 102 mmol/L (ref 98–110)
Creat: 0.82 mg/dL (ref 0.50–1.10)
Globulin: 2.4 g/dL (calc) (ref 1.9–3.7)
Glucose, Bld: 93 mg/dL (ref 65–99)
Potassium: 4.3 mmol/L (ref 3.5–5.3)
Sodium: 137 mmol/L (ref 135–146)
Total Bilirubin: 0.4 mg/dL (ref 0.2–1.2)
Total Protein: 6.4 g/dL (ref 6.1–8.1)

## 2020-03-07 LAB — LIPID PANEL
Cholesterol: 177 mg/dL (ref <200)
HDL: 77 mg/dL (ref >=50)
LDL Cholesterol (Calc): 86 mg/dL (calc)
Non-HDL Cholesterol (Calc): 100 mg/dL (calc) (ref <130)
Total CHOL/HDL Ratio: 2.3 (calc) (ref <5.0)
Triglycerides: 59 mg/dL (ref <150)

## 2020-03-07 LAB — HEPATITIS C ANTIBODY
Hepatitis C Ab: NONREACTIVE
SIGNAL TO CUT-OFF: 0.02 (ref <1.00)

## 2020-03-07 LAB — HEPATITIS B SURFACE ANTIGEN: Hepatitis B Surface Ag: NONREACTIVE

## 2020-03-07 LAB — RPR: RPR Ser Ql: NONREACTIVE

## 2020-03-07 LAB — HIV ANTIBODY (ROUTINE TESTING W REFLEX): HIV 1&2 Ab, 4th Generation: NONREACTIVE

## 2020-03-09 LAB — PAP IG, CT-NG NAA, HPV HIGH-RISK
C. trachomatis RNA, TMA: NOT DETECTED
HPV DNA High Risk: NOT DETECTED
N. gonorrhoeae RNA, TMA: NOT DETECTED

## 2020-03-15 ENCOUNTER — Ambulatory Visit: Payer: Medicaid Other | Attending: Internal Medicine

## 2020-03-15 DIAGNOSIS — Z23 Encounter for immunization: Secondary | ICD-10-CM

## 2020-03-15 NOTE — Progress Notes (Signed)
   Covid-19 Vaccination Clinic  Name:  Monica Pratt    MRN: [id number] DOB: Apr 11, 1991  03/15/2020  Monica Pratt was observed post Covid-19 immunization for 15 minutes without incident. She was provided with Vaccine Information Sheet and instruction to access the V-Safe system.   Monica Pratt was instructed to call 911 with any severe reactions post vaccine: Marland Kitchen Difficulty breathing  . Swelling of face and throat  . A fast heartbeat  . A bad rash all over body  . Dizziness and weakness   Immunizations Administered    Name Date Dose VIS Date Route   Pfizer COVID-19 Vaccine 03/15/2020  8:16 AM 0.3 mL 12/02/2019 Intramuscular   Manufacturer: [place]   Lot: [id number]   [place]: [id number]

## 2020-04-10 ENCOUNTER — Ambulatory Visit: Payer: Medicaid Other | Attending: Internal Medicine

## 2020-04-10 DIAGNOSIS — Z23 Encounter for immunization: Secondary | ICD-10-CM

## 2020-04-10 NOTE — Progress Notes (Signed)
   Covid-19 Vaccination Clinic  Name:  Monica Pratt    MRN: [id number] DOB: [birth year]  04/10/2020  Monica Pratt was observed post Covid-19 immunization for 15 minutes without incident. She was provided with Vaccine Information Sheet and instruction to access the V-Safe system.   Monica Pratt was instructed to call 911 with any severe reactions post vaccine: Marland Kitchen Difficulty breathing  . Swelling of face and throat  . A fast heartbeat  . A bad rash all over body  . Dizziness and weakness   Immunizations Administered    Name Date Dose VIS Date Route   Pfizer COVID-19 Vaccine 04/10/2020 11:30 AM 0.3 mL 02/15/2019 Intramuscular   Manufacturer: [employer], [employer]   Lot: [id number]   [place]: [id number]

## 2020-05-10 ENCOUNTER — Encounter: Payer: Self-pay | Admitting: Nurse Practitioner

## 2020-05-10 ENCOUNTER — Other Ambulatory Visit: Payer: Self-pay

## 2020-05-10 ENCOUNTER — Ambulatory Visit (INDEPENDENT_AMBULATORY_CARE_PROVIDER_SITE_OTHER): Payer: Medicaid Other | Admitting: Nurse Practitioner

## 2020-05-10 VITALS — BP 104/70

## 2020-05-10 DIAGNOSIS — N939 Abnormal uterine and vaginal bleeding, unspecified: Secondary | ICD-10-CM | POA: Diagnosis not present

## 2020-05-10 DIAGNOSIS — N921 Excessive and frequent menstruation with irregular cycle: Secondary | ICD-10-CM | POA: Diagnosis not present

## 2020-05-10 MED ORDER — MEGESTROL ACETATE 40 MG PO TABS: 40.0000 mg | ORAL_TABLET | Freq: Two times a day (BID) | ORAL | 1 refills | Status: DC

## 2020-05-10 NOTE — Progress Notes (Signed)
   Acute Office Visit  Subjective:    Patient ID: Monica Pratt, female    DOB: 24-Feb-1991, 29 y.o.   MRN: [id number]  Chief Complaint  Patient presents with  . PROBLEM VISIT    C/O: bleeding since april 29th and RLQ pain on and off     HPI Presents today for abnormal uterine bleeding that started 04/19/2020.  Bleeding started a couple days before her scheduled period, was heavy initially and then moderate.  She has no bleeding today.  Prior to this she had regular cycles.  Also complains of mild, dull right-sided pelvic pain that began yesterday.  No contraception.  Denies possibility of pregnancy.  Denies urinary symptoms.  Says the only thing that has changed is that she received her second Covid vaccine and she is worried that this may be the cause.  Ultrasound 05/2018 showed normal uterine size, endometrial echo 11.9 mm, triple-layered in appearance.  Ovaries normal.  CT 10/18/2014 showed 2.1 cm intramural right uterine fibroid, bilateral dermoids, left 12.5 cm, right 12.0 cm.   Review of Systems  Constitutional: Negative.   Respiratory: Negative.   Cardiovascular: Negative.   Genitourinary: Positive for menstrual problem, pelvic pain (right-sided) and vaginal bleeding. Negative for dysuria, flank pain, frequency, hematuria and urgency.       Objective:    Physical Exam Constitutional:      Appearance: Normal appearance.  Cardiovascular:     Rate and Rhythm: Normal rate and regular rhythm.  Pulmonary:     Effort: Pulmonary effort is normal.     Breath sounds: Normal breath sounds.  Abdominal:     General: Abdomen is flat. There is no distension.     Tenderness: There is abdominal tenderness in the right lower quadrant and suprapubic area. There is no guarding or rebound.  Genitourinary:    General: Normal vulva.     Vagina: Normal. No erythema, tenderness or bleeding.     Cervix: Normal.     BP 104/70  Wt Readings from Last 3 Encounters:  03/06/20 182 lb (82.6 kg)    07/05/18 169 lb (76.7 kg)  05/28/18 171 lb (77.6 kg)        Assessment & Plan:   Problem List Items Addressed This Visit    None    Visit Diagnoses    Menorrhagia with irregular cycle    -  Primary   Relevant Medications   megestrol (MEGACE) 40 MG tablet   Other Relevant Orders   US PELVIS TRANSVAGINAL NON-OB (TV ONLY)   Abnormal uterine bleeding       Relevant Medications   megestrol (MEGACE) 40 MG tablet   Other Relevant Orders   US PELVIS TRANSVAGINAL NON-OB (TV ONLY)     Plan: We will schedule pelvic ultrasound to rule out abnormalities due to sudden change.  Instructed to take Megace twice a day if bleeding starts back prior to ultrasound.     [name] [facility], 3:19 PM 05/10/2020

## 2020-05-23 ENCOUNTER — Other Ambulatory Visit: Payer: Self-pay

## 2020-05-24 ENCOUNTER — Ambulatory Visit (INDEPENDENT_AMBULATORY_CARE_PROVIDER_SITE_OTHER): Payer: Medicaid Other | Admitting: Nurse Practitioner

## 2020-05-24 ENCOUNTER — Ambulatory Visit (INDEPENDENT_AMBULATORY_CARE_PROVIDER_SITE_OTHER): Payer: Medicaid Other

## 2020-05-24 ENCOUNTER — Encounter: Payer: Self-pay | Admitting: Nurse Practitioner

## 2020-05-24 VITALS — BP 122/78

## 2020-05-24 DIAGNOSIS — N921 Excessive and frequent menstruation with irregular cycle: Secondary | ICD-10-CM | POA: Diagnosis not present

## 2020-05-24 DIAGNOSIS — Z30011 Encounter for initial prescription of contraceptive pills: Secondary | ICD-10-CM | POA: Diagnosis not present

## 2020-05-24 DIAGNOSIS — N939 Abnormal uterine and vaginal bleeding, unspecified: Secondary | ICD-10-CM | POA: Diagnosis not present

## 2020-05-24 MED ORDER — NORETHIN ACE-ETH ESTRAD-FE 1-20 MG-MCG PO TABS: 1.0000 | ORAL_TABLET | Freq: Every day | ORAL | 4 refills | Status: DC

## 2020-05-24 NOTE — Patient Instructions (Signed)
Oral Contraception Use Oral contraceptive pills (OCPs) are medicines that you take to prevent pregnancy. OCPs work by:  Preventing the ovaries from releasing eggs.  Thickening mucus in the lower part of the uterus (cervix), which prevents sperm from entering the uterus.  Thinning the lining of the uterus (endometrium), which prevents a fertilized egg from attaching to the endometrium. OCPs are highly effective when taken exactly as prescribed. However, OCPs do not prevent sexually transmitted infections (STIs). Safe sex practices, such as using condoms while on an OCP, can help prevent STIs. Before taking OCPs, you may have a physical exam, blood test, and Pap test. A Pap test involves taking a sample of cells from your cervix to check for cancer. Discuss with your health care provider the possible side effects of the OCP you may be prescribed. When you start an OCP, be aware that it can take 2-3 months for your body to adjust to changes in hormone levels. How to take oral contraceptive pills Follow instructions from your health care provider about how to start taking your first cycle of OCPs. Your health care provider may recommend that you:  Start the pill on day 1 of your menstrual period. If you start at this time, you will not need any backup form of birth control (contraception), such as condoms.  Start the pill on the first Sunday after your menstrual period or on the day you get your prescription. In these cases, you will need to use backup contraception for the first week.  Start the pill at any time of your cycle. ? If you take the pill within 5 days of the start of your period, you will not need a backup form of contraception. ? If you start at any other time of your menstrual cycle, you will need to use another form of contraception for 7 days. If your OCP is the type called a minipill, it will protect you from pregnancy after taking it for 2 days (48 hours), and you can stop using  backup contraception after that time. After you have started taking OCPs:  If you forget to take 1 pill, take it as soon as you remember. Take the next pill at the regular time.  If you miss 2 or more pills, call your health care provider. Different pills have different instructions for missed doses. Use backup birth control until your next menstrual period starts.  If you use a 28-day pack that contains inactive pills and you miss 1 of the last 7 pills (pills with no hormones), throw away the rest of the non-hormone pills and start a new pill pack. No matter which day you start the OCP, you will always start a new pack on that same day of the week. Have an extra pack of OCPs and a backup contraceptive method available in case you miss some pills or lose your OCP pack. Follow these instructions at home:  Do not use any products that contain nicotine or tobacco, such as cigarettes and e-cigarettes. If you need help quitting, ask your health care provider.  Always use a condom to protect against STIs. OCPs do not protect against STIs.  Use a calendar to mark the days of your menstrual period.  Read the information and directions that came with your OCP. Talk to your health care provider if you have questions. Contact a health care provider if:  You develop nausea and vomiting.  You have abnormal vaginal discharge or bleeding.  You develop a rash.    You miss your menstrual period. Depending on the type of OCP you are taking, this may be a sign of pregnancy. Ask your health care provider for more information.  You are losing your hair.  You need treatment for mood swings or depression.  You get dizzy when taking the OCP.  You develop acne after taking the OCP.  You become pregnant or think you may be pregnant.  You have diarrhea, constipation, and abdominal pain or cramps.  You miss 2 or more pills. Get help right away if:  You develop chest pain.  You develop shortness of  breath.  You have an uncontrolled or severe headache.  You develop numbness or slurred speech.  You develop visual or speech problems.  You develop pain, redness, and swelling in your legs.  You develop weakness or numbness in your arms or legs. Summary  Oral contraceptive pills (OCPs) are medicines that you take to prevent pregnancy.  OCPs do not prevent sexually transmitted infections (STIs). Always use a condom to protect against STIs.  When you start an OCP, be aware that it can take 2-3 months for your body to adjust to changes in hormone levels.  Read all the information and directions that come with your OCP. This information is not intended to replace advice given to you by your health care provider. Make sure you discuss any questions you have with your health care provider. Document Revised: 04/01/2019 Document Reviewed: 01/19/2017 Elsevier Patient Education  2020 Elsevier Inc.  

## 2020-05-24 NOTE — Progress Notes (Signed)
History: 29 year old G2 P1 presents today for ultrasound follow-up.  Was seen 05/10/2020 with complaints of abnormal bleeding that started 04/19/2020, a couple of days before her expected menstrual cycle, that started off heavy and then became moderate for a few weeks.  Prior to this she had regular cycles.  She also complained of right-sided pelvic pain.  Ultrasound 05/2018 showed normal uterine size, endometrial echo 11.9 mm, triple layered in appearance.  Ovaries normal.  CT 10/18/2014 showed 2.1 cm intramural right uterine fibroid, bilateral dermoids, left 12.5 cm, right 12.0 cm.  Has had 1 day of bleeding in the past 2 weeks.  Exam: Appears well Ultrasound: Anteverted uterus, normal size and shape, no myometrial masses.  Symmetrical secretory endometrium, no masses or thickening seen.  Both ovaries normal  Assessment: Normal pelvic ultrasound Menorrhagia with irregular cycle Encounter for oral contraception initial prescription  Plan: Would like to start oral contraception.  Loestrin Fe 1-20 prescription printed for patient to take to pharmacy.  Education provided on proper use and slight risk for blood clots. Follow up as needed.

## 2020-06-18 ENCOUNTER — Other Ambulatory Visit: Payer: Self-pay

## 2020-06-18 ENCOUNTER — Ambulatory Visit: Payer: Medicaid Other | Admitting: Nurse Practitioner

## 2020-06-18 ENCOUNTER — Encounter: Payer: Self-pay | Admitting: Nurse Practitioner

## 2020-06-18 VITALS — BP 118/74

## 2020-06-18 DIAGNOSIS — Z3490 Encounter for supervision of normal pregnancy, unspecified, unspecified trimester: Secondary | ICD-10-CM

## 2020-06-18 DIAGNOSIS — Z3201 Encounter for pregnancy test, result positive: Secondary | ICD-10-CM

## 2020-06-18 DIAGNOSIS — N926 Irregular menstruation, unspecified: Secondary | ICD-10-CM

## 2020-06-18 LAB — PREGNANCY, URINE: Preg Test, Ur: POSITIVE — AB

## 2020-06-18 NOTE — Progress Notes (Signed)
   Acute Office Visit  Subjective:    Patient ID: Monica Pratt, female    DOB: Dec 12, 1991, 29 y.o.   MRN: [id number]  Chief Complaint  Patient presents with  . Positive home preg test    HPI 29 year old presents for positive home pregnancy test. She believes her last LMP was 04/26/2020 where she bled for 2 weeks. She was seen by me 05/10/2020 for this, denied possibility of pregnancy. Ultrasound 05/24/2020 was normal with no pregnancy identified at that time. Started OCPs this same day. She does not want to proceed with the pregnancy.    Review of Systems  Constitutional: Negative.   Gastrointestinal: Positive for nausea.  Genitourinary: Negative for vaginal bleeding.       Mild cramping at times       Objective:    Physical Exam Constitutional:      Appearance: Normal appearance.     BP 118/74   LMP 04/26/2020 (Approximate)  Wt Readings from Last 3 Encounters:  03/06/20 182 lb (82.6 kg)  07/05/18 169 lb (76.7 kg)  05/28/18 171 lb (77.6 kg)   UPT positive     Assessment & Plan:   Problem List Items Addressed This Visit    None    Visit Diagnoses    Less than [redacted] weeks gestation of pregnancy    -  Primary   Relevant Orders   B-HCG Quant   US OB Transvaginal   Irregular menses       Relevant Orders   Pregnancy, urine (Completed)     Plan: Unsure of gestational age so we will do a quant HCG and schedule an ultrasound for this week. We will provide her with resources at that time to assist her in decision making. She is agreeable to plan.      [name] [facility], 4:33 PM 06/18/2020

## 2020-06-28 ENCOUNTER — Ambulatory Visit: Payer: Medicaid Other | Admitting: Nurse Practitioner

## 2020-06-28 ENCOUNTER — Other Ambulatory Visit: Payer: Medicaid Other

## 2021-03-07 ENCOUNTER — Other Ambulatory Visit: Payer: Self-pay

## 2021-03-07 ENCOUNTER — Encounter: Payer: Self-pay | Admitting: Nurse Practitioner

## 2021-03-07 ENCOUNTER — Ambulatory Visit (INDEPENDENT_AMBULATORY_CARE_PROVIDER_SITE_OTHER): Payer: Medicaid Other | Admitting: Nurse Practitioner

## 2021-03-07 VITALS — BP 112/60 | HR 64 | Ht 68.5 in | Wt 183.0 lb

## 2021-03-07 DIAGNOSIS — Z8742 Personal history of other diseases of the female genital tract: Secondary | ICD-10-CM

## 2021-03-07 DIAGNOSIS — Z0000 Encounter for general adult medical examination without abnormal findings: Secondary | ICD-10-CM

## 2021-03-07 DIAGNOSIS — Z113 Encounter for screening for infections with a predominantly sexual mode of transmission: Secondary | ICD-10-CM | POA: Diagnosis not present

## 2021-03-07 DIAGNOSIS — Z9889 Other specified postprocedural states: Secondary | ICD-10-CM

## 2021-03-07 DIAGNOSIS — Z01419 Encounter for gynecological examination (general) (routine) without abnormal findings: Secondary | ICD-10-CM | POA: Diagnosis not present

## 2021-03-07 DIAGNOSIS — R1032 Left lower quadrant pain: Secondary | ICD-10-CM | POA: Diagnosis not present

## 2021-03-07 NOTE — Progress Notes (Signed)
   Monica Pratt September 13, 1991 [id number]   History:  30 y.o. [id number] presents for annual exam with complaints of left lower abdominal pain that started about a month ago. It is constant and dull. 2015 left ovarian cystectomy. 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 She is not currently sexually active but would like STD screening today.   Gynecologic History Patient's last menstrual period was 02/09/2021.   Contraception/Family planning: abstinence  Health Maintenance Last Pap: 03/06/2020. Results were: normal Last mammogram: N/A Last colonoscopy: N/A Last Dexa: N/A  Past medical history, past surgical history, family history and social history were all reviewed and documented in the EPIC chart. 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  ROS:  A ROS was performed and pertinent positives and negatives are included.  Exam:  Vitals:   03/07/21 1342  BP: 112/60  Pulse: 64  Weight: 183 lb (83 kg)  Height: 5' 8.5" (1.74 m)   Body mass index is 27.42 kg/m.  General appearance:  Normal Thyroid:  Symmetrical, normal in size, without palpable masses or nodularity. Respiratory  Auscultation:  Clear without wheezing or rhonchi Cardiovascular  Auscultation:  Regular rate, without rubs, murmurs or gallops  Edema/varicosities:  Not grossly evident Abdominal  Soft,nontender, without masses, guarding or rebound.  Liver/spleen:  No organomegaly noted  Hernia:  None appreciated  Skin  Inspection:  Grossly normal   Breasts: Examined lying and sitting.   Right: Without masses, retractions, discharge or axillary adenopathy.   Left: Without masses, retractions, discharge or axillary adenopathy. Gentitourinary   Inguinal/mons:  Normal without inguinal adenopathy  External genitalia:  Normal  BUS/Urethra/Skene's glands:  Normal  Vagina:  Normal  Cervix:  Normal  Uterus:  Normal in size, shape and contour.  Midline and mobile  Adnexa/parametria:     Rt: Without masses or  tenderness.   Lt: Without masses or tenderness.  Anus and perineum: Normal  Assessment/Plan:  30 y.o. [id number] for annual exam.   Well female exam with routine gynecological exam - Plan: CBC with Differential/Platelet, Comprehensive metabolic panel, Pap IG w/ reflex to HPV when ASC-U. Education provided on SBEs, importance of preventative screenings, current guidelines, high calcium diet, regular exercise, and multivitamin daily.   Screen for STD (sexually transmitted disease) - Plan: HIV Antibody (routine testing w rflx), RPR, SURESWAB CT/NG/T. vaginalis  Left lower quadrant abdominal pain - Plan: US PELVIS TRANSVAGINAL NON-OB (TV ONLY). Pain began about a month ago and is constant and dull.   History of ovarian cystectomy - 2015. 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 Will repeat at 3-year interval per guidelines.   Return in 1 year for annual.      [name] DNP, 1:54 PM 03/07/2021

## 2021-03-07 NOTE — Addendum Note (Signed)
Addended by: Terence Lux A on: 03/07/2021 04:54 PM   Modules accepted: Orders

## 2021-03-07 NOTE — Patient Instructions (Signed)
Health Maintenance, Female Adopting a healthy lifestyle and getting preventive care are important in promoting health and wellness. Ask your health care provider about:  The right schedule for you to have regular tests and exams.  Things you can do on your own to prevent diseases and keep yourself healthy. What should I know about diet, weight, and exercise? Eat a healthy diet  Eat a diet that includes plenty of vegetables, fruits, low-fat dairy products, and lean protein.  Do not eat a lot of foods that are high in solid fats, added sugars, or sodium.   Maintain a healthy weight Body mass index (BMI) is used to identify weight problems. It estimates body fat based on height and weight. Your health care provider can help determine your BMI and help you achieve or maintain a healthy weight. Get regular exercise Get regular exercise. This is one of the most important things you can do for your health. Most adults should:  Exercise for at least 150 minutes each week. The exercise should increase your heart rate and make you sweat (moderate-intensity exercise).  Do strengthening exercises at least twice a week. This is in addition to the moderate-intensity exercise.  Spend less time sitting. Even light physical activity can be beneficial. Watch cholesterol and blood lipids Have your blood tested for lipids and cholesterol at 32 years of age, then have this test every 5 years. Have your cholesterol levels checked more often if:  Your lipid or cholesterol levels are high.  You are older than 32 years of age.  You are at high risk for heart disease. What should I know about cancer screening? Depending on your health history and family history, you may need to have cancer screening at various ages. This may include screening for:  Breast cancer.  Cervical cancer.  Colorectal cancer.  Skin cancer.  Lung cancer. What should I know about heart disease, diabetes, and high blood  pressure? Blood pressure and heart disease  High blood pressure causes heart disease and increases the risk of stroke. This is more likely to develop in people who have high blood pressure readings, are of African descent, or are overweight.  Have your blood pressure checked: ? Every 3-5 years if you are 18-39 years of age. ? Every year if you are 40 years old or older. Diabetes Have regular diabetes screenings. This checks your fasting blood sugar level. Have the screening done:  Once every three years after age 40 if you are at a normal weight and have a low risk for diabetes.  More often and at a younger age if you are overweight or have a high risk for diabetes. What should I know about preventing infection? Hepatitis B If you have a higher risk for hepatitis B, you should be screened for this virus. Talk with your health care provider to find out if you are at risk for hepatitis B infection. Hepatitis C Testing is recommended for:  Everyone born from 1945 through 1965.  Anyone with known risk factors for hepatitis C. Sexually transmitted infections (STIs)  Get screened for STIs, including gonorrhea and chlamydia, if: ? You are sexually active and are younger than 32 years of age. ? You are older than 32 years of age and your health care provider tells you that you are at risk for this type of infection. ? Your sexual activity has changed since you were last screened, and you are at increased risk for chlamydia or gonorrhea. Ask your health care provider   if you are at risk.  Ask your health care provider about whether you are at high risk for HIV. Your health care provider may recommend a prescription medicine to help prevent HIV infection. If you choose to take medicine to prevent HIV, you should first get tested for HIV. You should then be tested every 3 months for as long as you are taking the medicine. Pregnancy  If you are about to stop having your period (premenopausal) and  you may become pregnant, seek counseling before you get pregnant.  Take 400 to 800 micrograms (mcg) of folic acid every day if you become pregnant.  Ask for birth control (contraception) if you want to prevent pregnancy. Osteoporosis and menopause Osteoporosis is a disease in which the bones lose minerals and strength with aging. This can result in bone fractures. If you are 65 years old or older, or if you are at risk for osteoporosis and fractures, ask your health care provider if you should:  Be screened for bone loss.  Take a calcium or vitamin D supplement to lower your risk of fractures.  Be given hormone replacement therapy (HRT) to treat symptoms of menopause. Follow these instructions at home: Lifestyle  Do not use any products that contain nicotine or tobacco, such as cigarettes, e-cigarettes, and chewing tobacco. If you need help quitting, ask your health care provider.  Do not use street drugs.  Do not share needles.  Ask your health care provider for help if you need support or information about quitting drugs. Alcohol use  Do not drink alcohol if: ? Your health care provider tells you not to drink. ? You are pregnant, may be pregnant, or are planning to become pregnant.  If you drink alcohol: ? Limit how much you use to 0-1 drink a day. ? Limit intake if you are breastfeeding.  Be aware of how much alcohol is in your drink. In the [place] one drink equals one 12 oz bottle of beer (355 mL), one 5 oz glass of wine (148 mL), or one 1 oz glass of hard liquor (44 mL). General instructions  Schedule regular health, dental, and eye exams.  Stay current with your vaccines.  Tell your health care provider if: ? You often feel depressed. ? You have ever been abused or do not feel safe at home. Summary  Adopting a healthy lifestyle and getting preventive care are important in promoting health and wellness.  Follow your health care provider's instructions about healthy  diet, exercising, and getting tested or screened for diseases.  Follow your health care provider's instructions on monitoring your cholesterol and blood pressure. This information is not intended to replace advice given to you by your health care provider. Make sure you discuss any questions you have with your health care provider. Document Revised: 12/01/2018 Document Reviewed: 12/01/2018 Elsevier Patient Education  2021 Elsevier Inc.  

## 2021-03-08 LAB — CBC WITH DIFFERENTIAL/PLATELET
Absolute Monocytes: 394 cells/uL (ref 200–950)
Basophils Absolute: 41 cells/uL (ref 0–200)
Basophils Relative: 0.7 %
Eosinophils Absolute: 99 cells/uL (ref 15–500)
Eosinophils Relative: 1.7 %
HCT: 39.1 % (ref 35.0–45.0)
Hemoglobin: 13 g/dL (ref 11.7–15.5)
Lymphs Abs: 1746 cells/uL (ref 850–3900)
MCH: 32.3 pg (ref 27.0–33.0)
MCHC: 33.2 g/dL (ref 32.0–36.0)
MCV: 97 fL (ref 80.0–100.0)
MPV: 10.1 fL (ref 7.5–12.5)
Monocytes Relative: 6.8 %
Neutro Abs: 3521 cells/uL (ref 1500–7800)
Neutrophils Relative %: 60.7 %
Platelets: 287 10*3/uL (ref 140–400)
RBC: 4.03 10*6/uL (ref 3.80–5.10)
RDW: 12.1 % (ref 11.0–15.0)
Total Lymphocyte: 30.1 %
WBC: 5.8 10*3/uL (ref 3.8–10.8)

## 2021-03-08 LAB — COMPREHENSIVE METABOLIC PANEL
AG Ratio: 1.5 (calc) (ref 1.0–2.5)
ALT: 13 U/L (ref 6–29)
AST: 18 U/L (ref 10–30)
Albumin: 4.4 g/dL (ref 3.6–5.1)
Alkaline phosphatase (APISO): 42 U/L (ref 31–125)
BUN: 10 mg/dL (ref 7–25)
CO2: 26 mmol/L (ref 20–32)
Calcium: 9.4 mg/dL (ref 8.6–10.2)
Chloride: 103 mmol/L (ref 98–110)
Creat: 0.72 mg/dL (ref 0.50–1.10)
Globulin: 2.9 g/dL (calc) (ref 1.9–3.7)
Glucose, Bld: 79 mg/dL (ref 65–99)
Potassium: 4.2 mmol/L (ref 3.5–5.3)
Sodium: 137 mmol/L (ref 135–146)
Total Bilirubin: 0.7 mg/dL (ref 0.2–1.2)
Total Protein: 7.3 g/dL (ref 6.1–8.1)

## 2021-03-08 LAB — SURESWAB CT/NG/T. VAGINALIS
C. trachomatis RNA, TMA: NOT DETECTED
N. gonorrhoeae RNA, TMA: NOT DETECTED
Trichomonas vaginalis RNA: NOT DETECTED

## 2021-03-08 LAB — RPR: RPR Ser Ql: NONREACTIVE

## 2021-03-08 LAB — HIV ANTIBODY (ROUTINE TESTING W REFLEX): HIV 1&2 Ab, 4th Generation: NONREACTIVE

## 2021-04-04 ENCOUNTER — Ambulatory Visit: Payer: Medicaid Other | Admitting: Obstetrics & Gynecology

## 2021-04-04 ENCOUNTER — Ambulatory Visit (INDEPENDENT_AMBULATORY_CARE_PROVIDER_SITE_OTHER): Payer: Medicaid Other

## 2021-04-04 ENCOUNTER — Other Ambulatory Visit: Payer: Self-pay

## 2021-04-04 DIAGNOSIS — R1032 Left lower quadrant pain: Secondary | ICD-10-CM

## 2021-04-04 DIAGNOSIS — Z8742 Personal history of other diseases of the female genital tract: Secondary | ICD-10-CM

## 2021-04-04 DIAGNOSIS — Z9889 Other specified postprocedural states: Secondary | ICD-10-CM

## 2021-04-04 NOTE — Progress Notes (Signed)
    Monica Pratt August 26, 1991 [id number]        29 y.o.  [id number]   RP: LLQ pain for Pelvic US  HPI: Menses regular normal every month.  Had LLQ pain last cycle.  LMP 03/18/2021.  No pain since then.  H/O Left Ovarian Dermoid removed in 2015.     OB History  Gravida Para Term Preterm AB Living  3       2 1   SAB IAB Ectopic Multiple Live Births    2          # Outcome Date GA Lbr Len/2nd Weight Sex Delivery Anes PTL Lv  3 IAB           2 Gravida           1 IAB             Past medical history,surgical history, problem list, medications, allergies, family history and social history were all reviewed and documented in the EPIC chart.   Directed ROS with pertinent positives and negatives documented in the history of present illness/assessment and plan.  Exam:  There were no vitals filed for this visit. General appearance:  Normal  Pelvic US today: T/V images.  Anteverted uterus normal in size and shape with no myometrial mass.  The uterus is measured at 8.64 x 6.54 x 4.22 cm.  Symmetrical endometrial lining which is echogenic with no mass seen.  The endometrium is measured at 13.52 mm which is normal on day 25 of the cycle.  Both ovaries are normal in size.  The right ovary presents a thick walled collapsed corpus luteum which is avascular measured at 1.3 cm.  The left ovary presents a very small nodule which is solid, echogenic measured at 1.3 x 1 cm and avascular, at the inferior aspect of the left ovary, possible dermoid.  No adnexal mass otherwise.  No free fluid in the posterior cul-de-sac.   Assessment/Plan:  30 y.o. [id number]   1. Left lower quadrant abdominal pain Pelvic ultrasound findings reviewed thoroughly with patient.  Left lower quadrant pain has resolved.  Incidental finding of a very small echogenic nodule on the left ovary measured at 1.3 cm and avascular.  Possible very small dermoid.  Decision to repeat a pelvic ultrasound at 4 to 6 weeks to reassess and confirm  stability. - US Transvaginal Non-OB; Future  2. History of ovarian cystectomy History of dermoid removed from the left ovary.  Possible very small dermoid/remaining of the previously removed dermoid at the left ovary measured at 1.3 cm.  Decision to observe and repeat a pelvic ultrasound at 4 to 6 months to reassess and confirm stability. - US Transvaginal Non-OB; Future  Princess Bruins MD, 5:09 PM 04/04/2021

## 2021-04-09 ENCOUNTER — Encounter: Payer: Self-pay | Admitting: Obstetrics & Gynecology

## 2021-11-07 ENCOUNTER — Ambulatory Visit: Payer: Medicaid Other | Admitting: Obstetrics & Gynecology

## 2021-11-07 ENCOUNTER — Other Ambulatory Visit: Payer: Self-pay

## 2021-11-07 ENCOUNTER — Encounter: Payer: Self-pay | Admitting: Obstetrics & Gynecology

## 2021-11-07 ENCOUNTER — Ambulatory Visit (INDEPENDENT_AMBULATORY_CARE_PROVIDER_SITE_OTHER): Payer: Medicaid Other

## 2021-11-07 VITALS — BP 98/70

## 2021-11-07 DIAGNOSIS — Z86018 Personal history of other benign neoplasm: Secondary | ICD-10-CM

## 2021-11-07 DIAGNOSIS — R1032 Left lower quadrant pain: Secondary | ICD-10-CM

## 2021-11-07 DIAGNOSIS — Z9889 Other specified postprocedural states: Secondary | ICD-10-CM

## 2021-11-07 DIAGNOSIS — Z8742 Personal history of other diseases of the female genital tract: Secondary | ICD-10-CM

## 2021-11-07 DIAGNOSIS — E663 Overweight: Secondary | ICD-10-CM | POA: Diagnosis not present

## 2021-11-07 NOTE — Progress Notes (Signed)
    Monica Pratt 06/11/1991 [id number]        30 y.o.  [id number]   RP: LLQ pain/Left ovarian solid focus for f/u Pelvic US  HPI: Resolved LLQ pain.  Menses regular normal every month.  H/O Lt Ovarian Dermoid Cyst Excision.  Overweight with difficulty loosing weight.   OB History  Gravida Para Term Preterm AB Living  3       2 1   SAB IAB Ectopic Multiple Live Births    2          # Outcome Date GA Lbr Len/2nd Weight Sex Delivery Anes PTL Lv  3 IAB           2 Gravida           1 IAB             Past medical history,surgical history, problem list, medications, allergies, family history and social history were all reviewed and documented in the EPIC chart.   Directed ROS with pertinent positives and negatives documented in the history of present illness/assessment and plan.  Exam:  Vitals:   11/07/21 0926  BP: 98/70   General appearance:  Normal  Pelvic US today: T/V images.  Anteverted uterus, normal size and shape with no myometrial mass.  The uterus is measured at 8.06 x 6.31 x 5.42 cm.  The endometrial lining is symmetrical measured at 5.6 mm with no mass or thickening seen.  Patient is bleeding today.  Right ovary normal.  Left ovary with an avascular echogenic solid focal area which is measured at 1.6 x 1.2 x 1.4 cm (Previously measured at 1.3 cm on April 04, 2021 scan). Trace free fluid in the posterior cul-de-sac.   Assessment/Plan:  30 y.o. [id number]   1. Left lower quadrant abdominal pain Resolved left lower quadrant abdominal pain.  History of excision of the left ovarian dermoid cyst.  Left ovarian solid focus is avascular and very small, could correspond to a small remnant of the dermoid cyst or scarring, benign appearance.  Patient reassured.  Will reevaluate by pelvic ultrasound in a year. - US Transvaginal Non-OB; Future  2. H/O excision of dermoid cyst - US Transvaginal Non-OB; Future  3. Overweight (BMI 25.0-29.9) Difficulty losing weight.  Will rule out  hypothyroidism with a TSH today.  Recommendation to use a low calorie/carb diet.  Could go towards a ketone diet and do intermittent fasting with good p.o. hydration with water.  Regular fitness activities including aerobic activities 5 times a week and light weightlifting every 2 days recommended. - TSH  Other orders - naproxen (EC NAPROSYN) 500 MG EC tablet; Take by mouth. - cyclobenzaprine (FLEXERIL) 10 MG tablet; Take by mouth.   Princess Bruins MD, 9:44 AM 11/07/2021

## 2021-11-08 LAB — TSH: TSH: 0.82 mIU/L

## 2022-03-10 NOTE — Progress Notes (Signed)
? ?Sydne Krahl 21-Jan-1991 [id number] ? ? ?History:  31 y.o. [id number] presents for annual exam. 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 2015 left ovarian dermoid cyst excision. Ultrasound 10/2021 showed small solid focus, avascular area that could be residual of previous dermoid cyst or scar tissue with recommendations to repeat ultrasound in 1 year. She has had a 15 pound weight gain over the last year despite increasing exercise and working on diet. She is considering a pescatarian diet. She does eat dinner late most nights. She does 35-60 minutes of exercise most days, alternating cardio and weight training. Normal TSH in November.  ? ?Gynecologic History ?Patient's last menstrual period was 02/25/2022. ?Period Cycle (Days): 28 ?Period Duration (Days): 6 ?Period Pattern: Regular ?Menstrual Flow: Moderate ?Dysmenorrhea: (!) Moderate ?Dysmenorrhea Symptoms: Cramping ?Contraception/Family planning: abstinence ?Sexually active: No ? ?Health Maintenance ?Last Pap: 03/06/2020. Results were: normal, 3-year repeat ?Last mammogram: Not indicated ?Last colonoscopy: Not indicated ?Last Dexa: Not indicated ? ?Past medical history, past surgical history, family history and social history were all reviewed and documented in the EPIC chart. 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 ? ?ROS:  A ROS was performed and pertinent positives and negatives are included. ? ?Exam: ? ?Vitals:  ? 03/11/22 1101  ?BP: 118/74  ?Weight: 196 lb (88.9 kg)  ?Height: '5\' 8"'$  (1.727 m)  ? ? ?Body mass index is 29.8 kg/m?. ? ?General appearance:  Normal ?Thyroid:  Symmetrical, normal in size, without palpable masses or nodularity. ?Respiratory ? Auscultation:  Clear without wheezing or rhonchi ?Cardiovascular ? Auscultation:  Regular rate, without rubs, murmurs or gallops ? Edema/varicosities:  Not grossly evident ?Abdominal ? Soft,nontender, without masses, guarding or rebound. ? Liver/spleen:  No organomegaly noted ? Hernia:  None  appreciated ? Skin ? Inspection:  Grossly normal ?  ?Breasts: Examined lying and sitting.  ? Right: Without masses, retractions, discharge or axillary adenopathy. ? ? Left: Without masses, retractions, discharge or axillary adenopathy. ?Genitourinary  ? Inguinal/mons:  Normal without inguinal adenopathy ? External genitalia:  Normal appearing vulva with no masses, tenderness, or lesions ? BUS/Urethra/Skene's glands:  Normal ? Vagina:  Normal appearing with normal color and discharge, no lesions ? Cervix:  Normal appearing without discharge or lesions ? Uterus:  Normal in size, shape and contour.  Midline and mobile, nontender ? Adnexa/parametria:   ?  Rt: Normal in size, without masses or tenderness. ?  Lt: Normal in size, without masses or tenderness. ? Anus and perineum: Normal ? Digital rectal exam: Not indicated ? ?Patient informed chaperone available to be present for breast and pelvic exam. Patient has requested no chaperone to be present. Patient has been advised what will be completed during breast and pelvic exam.  ? ?Assessment/Plan:  31 y.o. [id number] for annual exam.  ? ?Well female exam with routine gynecological exam - Plan: Comprehensive metabolic panel, CBC with Differential/Platelet, Lipid panel. Education provided on SBEs, importance of preventative screenings, current guidelines, high calcium diet, regular exercise, and multivitamin daily.  ? ?Weight gain - Plan: Lipid panel. We discussed diet, intermittent fasting, no late eating. Calorie deficit required for weight loss, recommend monitoring with app. Also recommend nutritionist and she is agreeable.  ? ?Restricted diet - Plan: Vitamin B12, VITAMIN D 25 Hydroxy (Vit-D Deficiency, Fractures).  ? 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 Will repeat at 3-year interval per guidelines.  ? ?Return in 1 year for annual.  ? ? ? ? ?[name] DNP, 11:29  AM 03/11/2022 ? ?

## 2022-03-11 ENCOUNTER — Encounter: Payer: Self-pay | Admitting: Nurse Practitioner

## 2022-03-11 ENCOUNTER — Other Ambulatory Visit: Payer: Self-pay

## 2022-03-11 ENCOUNTER — Ambulatory Visit (INDEPENDENT_AMBULATORY_CARE_PROVIDER_SITE_OTHER): Payer: Medicaid Other | Admitting: Nurse Practitioner

## 2022-03-11 VITALS — BP 118/74 | Ht 68.0 in | Wt 196.0 lb

## 2022-03-11 DIAGNOSIS — Z01419 Encounter for gynecological examination (general) (routine) without abnormal findings: Secondary | ICD-10-CM

## 2022-03-11 DIAGNOSIS — R635 Abnormal weight gain: Secondary | ICD-10-CM | POA: Diagnosis not present

## 2022-03-11 DIAGNOSIS — Z713 Dietary counseling and surveillance: Secondary | ICD-10-CM | POA: Diagnosis not present

## 2022-03-12 ENCOUNTER — Other Ambulatory Visit: Payer: Self-pay | Admitting: Nurse Practitioner

## 2022-03-12 DIAGNOSIS — E538 Deficiency of other specified B group vitamins: Secondary | ICD-10-CM

## 2022-03-12 DIAGNOSIS — E559 Vitamin D deficiency, unspecified: Secondary | ICD-10-CM

## 2022-03-12 LAB — CBC WITH DIFFERENTIAL/PLATELET
Absolute Monocytes: 366 cells/uL (ref 200–950)
Basophils Absolute: 43 cells/uL (ref 0–200)
Basophils Relative: 0.7 %
Eosinophils Absolute: 153 cells/uL (ref 15–500)
Eosinophils Relative: 2.5 %
HCT: 37.7 % (ref 35.0–45.0)
Hemoglobin: 12.3 g/dL (ref 11.7–15.5)
Lymphs Abs: 1684 cells/uL (ref 850–3900)
MCH: 31.5 pg (ref 27.0–33.0)
MCHC: 32.6 g/dL (ref 32.0–36.0)
MCV: 96.4 fL (ref 80.0–100.0)
MPV: 10.2 fL (ref 7.5–12.5)
Monocytes Relative: 6 %
Neutro Abs: 3855 cells/uL (ref 1500–7800)
Neutrophils Relative %: 63.2 %
Platelets: 343 10*3/uL (ref 140–400)
RBC: 3.91 10*6/uL (ref 3.80–5.10)
RDW: 12.9 % (ref 11.0–15.0)
Total Lymphocyte: 27.6 %
WBC: 6.1 10*3/uL (ref 3.8–10.8)

## 2022-03-12 LAB — COMPREHENSIVE METABOLIC PANEL
AG Ratio: 1.6 (calc) (ref 1.0–2.5)
ALT: 10 U/L (ref 6–29)
AST: 15 U/L (ref 10–30)
Albumin: 4.4 g/dL (ref 3.6–5.1)
Alkaline phosphatase (APISO): 51 U/L (ref 31–125)
BUN: 13 mg/dL (ref 7–25)
CO2: 26 mmol/L (ref 20–32)
Calcium: 9.7 mg/dL (ref 8.6–10.2)
Chloride: 105 mmol/L (ref 98–110)
Creat: 0.8 mg/dL (ref 0.50–0.97)
Globulin: 2.7 g/dL (calc) (ref 1.9–3.7)
Glucose, Bld: 88 mg/dL (ref 65–99)
Potassium: 4.5 mmol/L (ref 3.5–5.3)
Sodium: 141 mmol/L (ref 135–146)
Total Bilirubin: 0.5 mg/dL (ref 0.2–1.2)
Total Protein: 7.1 g/dL (ref 6.1–8.1)

## 2022-03-12 LAB — EXTRA SPECIMEN

## 2022-03-12 LAB — LIPID PANEL
Cholesterol: 208 mg/dL — ABNORMAL HIGH (ref <200)
HDL: 93 mg/dL (ref >=50)
LDL Cholesterol (Calc): 102 mg/dL (calc) — ABNORMAL HIGH
Non-HDL Cholesterol (Calc): 115 mg/dL (calc) (ref <130)
Total CHOL/HDL Ratio: 2.2 (calc) (ref <5.0)
Triglycerides: 44 mg/dL (ref <150)

## 2022-03-12 LAB — VITAMIN D 25 HYDROXY (VIT D DEFICIENCY, FRACTURES): Vit D, 25-Hydroxy: 23 ng/mL — ABNORMAL LOW (ref 30–100)

## 2022-03-12 LAB — VITAMIN B12: Vitamin B-12: 292 pg/mL (ref 200–1100)

## 2022-03-12 MED ORDER — VITAMIN D (ERGOCALCIFEROL) 1.25 MG (50000 UNIT) PO CAPS
50000.0000 [IU] | ORAL_CAPSULE | ORAL | 0 refills | Status: AC
Start: 2022-03-12 — End: 2022-05-01

## 2023-03-16 ENCOUNTER — Encounter: Payer: Self-pay | Admitting: Nurse Practitioner

## 2023-03-16 ENCOUNTER — Ambulatory Visit (INDEPENDENT_AMBULATORY_CARE_PROVIDER_SITE_OTHER): Payer: Medicaid Other | Admitting: Nurse Practitioner

## 2023-03-16 ENCOUNTER — Other Ambulatory Visit (HOSPITAL_COMMUNITY)
Admission: RE | Admit: 2023-03-16 | Discharge: 2023-03-16 | Disposition: A | Discharge status: Home / Self Care | Payer: Medicaid Other | Source: Ambulatory Visit | Attending: Nurse Practitioner | Admitting: Nurse Practitioner

## 2023-03-16 VITALS — BP 110/64 | HR 78 | Ht 69.0 in | Wt 190.0 lb

## 2023-03-16 DIAGNOSIS — Z124 Encounter for screening for malignant neoplasm of cervix: Secondary | ICD-10-CM | POA: Diagnosis not present

## 2023-03-16 DIAGNOSIS — Z01419 Encounter for gynecological examination (general) (routine) without abnormal findings: Secondary | ICD-10-CM | POA: Diagnosis not present

## 2023-03-16 DIAGNOSIS — R1032 Left lower quadrant pain: Secondary | ICD-10-CM

## 2023-03-16 DIAGNOSIS — Z9889 Other specified postprocedural states: Secondary | ICD-10-CM

## 2023-03-16 DIAGNOSIS — E785 Hyperlipidemia, unspecified: Secondary | ICD-10-CM | POA: Diagnosis not present

## 2023-03-16 DIAGNOSIS — Z713 Dietary counseling and surveillance: Secondary | ICD-10-CM

## 2023-03-16 DIAGNOSIS — E559 Vitamin D deficiency, unspecified: Secondary | ICD-10-CM | POA: Diagnosis not present

## 2023-03-16 NOTE — Progress Notes (Signed)
Monica Pratt 08-May-1991 [id number]   History:  32 y.o. [id number] presents for annual exam. Monthly cycles. 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 2015 left ovarian dermoid cyst excision. Ultrasound 10/2021 showed small solid focus, avascular area that could be residual of previous dermoid cyst or scar tissue with recommendations to repeat ultrasound in 1 year but not done by patient. She has been experiencing left lower back pain and intermittent fluttering in LLQ.   Gynecologic History Patient's last menstrual period was 02/18/2023. Period Cycle (Days): 28 Period Duration (Days): 4-5 Period Pattern: Regular Menstrual Flow: Moderate Menstrual Control: Tampon, Thin pad Menstrual Control Change Freq (Hours): 6 Dysmenorrhea: (!) Moderate Dysmenorrhea Symptoms: Cramping Contraception/Family planning: abstinence Sexually active: No  Health Maintenance Last Pap: 03/06/2020. 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   ROS:  A ROS was performed and pertinent positives and negatives are included.  Exam:  Vitals:   03/16/23 1107  BP: 110/64  Pulse: 78  SpO2: 100%  Weight: 190 lb (86.2 kg)  Height: 5\' 9"  (1.753 m)     Body mass index is 28.06 kg/m.  General appearance:  Normal Thyroid:  Symmetrical, normal in size, without palpable masses or nodularity. Respiratory  Auscultation:  Clear without wheezing or rhonchi Cardiovascular  Auscultation:  Regular rate, without rubs, murmurs or gallops  Edema/varicosities:  Not grossly evident Abdominal  Soft,nontender, without masses, guarding or rebound.  Liver/spleen:  No organomegaly noted  Hernia:  None appreciated  Skin  Inspection:  Grossly normal   Breasts:  Examined lying and sitting.   Right: Without masses, retractions, discharge or axillary adenopathy.   Left: Without masses, retractions, discharge or axillary adenopathy. Genitourinary   Inguinal/mons:  Normal without inguinal adenopathy  External genitalia:  Normal appearing vulva with no masses, tenderness, or lesions  BUS/Urethra/Skene's glands:  Normal  Vagina:  Normal appearing with normal color and discharge, no lesions  Cervix:  Normal appearing without discharge or lesions  Uterus:  Normal in size, shape and contour.  Midline and mobile, nontender  Adnexa/parametria:     Rt: Normal in size, without masses or tenderness.   Lt: Normal in size, without masses or tenderness.  Anus and perineum: Normal  Digital rectal exam: Not indicated  Patient informed chaperone available to be present for breast and pelvic exam. Patient has requested no chaperone to be present. Patient has been advised what will be completed during breast and pelvic exam.   Assessment/Plan:  32 y.o. [id number] for annual exam.   Well female exam with routine gynecological exam - Plan: CBC with Differential/Platelet, Comprehensive metabolic panel. Education provided on SBEs, importance of preventative screenings, current guidelines, high calcium diet, regular exercise, and multivitamin daily.   Vitamin D deficiency - Plan: VITAMIN D 25 Hydroxy (Vit-D Deficiency, Fractures)  Hyperlipidemia, unspecified hyperlipidemia type - Plan: Lipid panel  Screening for cervical cancer - Plan: Cytology - PAP( [facility]). 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  Restricted diet - Plan: Vitamin B12  History of dermoid cyst excision - Plan: US PELVIS TRANSVAGINAL NON-OB (TV ONLY)  Left lower quadrant abdominal pain - Plan: US PELVIS TRANSVAGINAL NON-OB (TV ONLY)  Return in 1 year for annual.      Monica Pratt A Monica Mahler DNP, 11:32  AM 03/16/2023

## 2023-03-17 LAB — LIPID PANEL
Cholesterol: 174 mg/dL (ref <200)
HDL: 80 mg/dL (ref >=50)
LDL Cholesterol (Calc): 81 mg/dL (calc)
Non-HDL Cholesterol (Calc): 94 mg/dL (calc) (ref <130)
Total CHOL/HDL Ratio: 2.2 (calc) (ref <5.0)
Triglycerides: 47 mg/dL (ref <150)

## 2023-03-17 LAB — CBC WITH DIFFERENTIAL/PLATELET
Absolute Monocytes: 320 cells/uL (ref 200–950)
Basophils Absolute: 40 cells/uL (ref 0–200)
Basophils Relative: 0.8 %
Eosinophils Absolute: 130 cells/uL (ref 15–500)
Eosinophils Relative: 2.6 %
HCT: 37.5 % (ref 35.0–45.0)
Hemoglobin: 12.5 g/dL (ref 11.7–15.5)
Lymphs Abs: 1860 cells/uL (ref 850–3900)
MCH: 31.8 pg (ref 27.0–33.0)
MCHC: 33.3 g/dL (ref 32.0–36.0)
MCV: 95.4 fL (ref 80.0–100.0)
MPV: 10.3 fL (ref 7.5–12.5)
Monocytes Relative: 6.4 %
Neutro Abs: 2650 cells/uL (ref 1500–7800)
Neutrophils Relative %: 53 %
Platelets: 305 10*3/uL (ref 140–400)
RBC: 3.93 10*6/uL (ref 3.80–5.10)
RDW: 12.5 % (ref 11.0–15.0)
Total Lymphocyte: 37.2 %
WBC: 5 10*3/uL (ref 3.8–10.8)

## 2023-03-17 LAB — COMPREHENSIVE METABOLIC PANEL
AG Ratio: 1.5 (calc) (ref 1.0–2.5)
ALT: 12 U/L (ref 6–29)
AST: 19 U/L (ref 10–30)
Albumin: 4.3 g/dL (ref 3.6–5.1)
Alkaline phosphatase (APISO): 47 U/L (ref 31–125)
BUN: 10 mg/dL (ref 7–25)
CO2: 26 mmol/L (ref 20–32)
Calcium: 9.4 mg/dL (ref 8.6–10.2)
Chloride: 104 mmol/L (ref 98–110)
Creat: 0.71 mg/dL (ref 0.50–0.97)
Globulin: 2.9 g/dL (calc) (ref 1.9–3.7)
Glucose, Bld: 86 mg/dL (ref 65–99)
Potassium: 4.1 mmol/L (ref 3.5–5.3)
Sodium: 136 mmol/L (ref 135–146)
Total Bilirubin: 0.5 mg/dL (ref 0.2–1.2)
Total Protein: 7.2 g/dL (ref 6.1–8.1)

## 2023-03-17 LAB — CYTOLOGY - PAP
Adequacy: ABSENT
Comment: NEGATIVE
Diagnosis: NEGATIVE
High risk HPV: NEGATIVE

## 2023-03-17 LAB — VITAMIN B12: Vitamin B-12: 546 pg/mL (ref 200–1100)

## 2023-03-17 LAB — VITAMIN D 25 HYDROXY (VIT D DEFICIENCY, FRACTURES): Vit D, 25-Hydroxy: 36 ng/mL (ref 30–100)

## 2023-05-14 ENCOUNTER — Ambulatory Visit (INDEPENDENT_AMBULATORY_CARE_PROVIDER_SITE_OTHER): Payer: Medicaid Other

## 2023-05-14 ENCOUNTER — Ambulatory Visit (INDEPENDENT_AMBULATORY_CARE_PROVIDER_SITE_OTHER): Payer: Medicaid Other | Admitting: Nurse Practitioner

## 2023-05-14 VITALS — BP 100/70

## 2023-05-14 DIAGNOSIS — Z9889 Other specified postprocedural states: Secondary | ICD-10-CM

## 2023-05-14 DIAGNOSIS — Z86018 Personal history of other benign neoplasm: Secondary | ICD-10-CM

## 2023-05-14 DIAGNOSIS — R1032 Left lower quadrant pain: Secondary | ICD-10-CM

## 2023-05-14 NOTE — Progress Notes (Signed)
   Acute Office Visit  Subjective:    Patient ID: Monica Pratt, female    DOB: 06/03/1991, 32 y.o.   MRN: [id number]   HPI 32 y.o. [id number] presents today for ultrasound. Seen for annual exam 03/16/2023 with complaints left lower back pain and intermittent fluttering in LLQ. 2015 left ovarian dermoid cyst excision. Ultrasound 10/2021 showed small solid focus, avascular area that could be residual of previous dermoid cyst or scar tissue with recommendations to repeat ultrasound in 1 year but not done by patient. Recently diagnosed with scoliosis, has been seeing chiropractor for a couple of months with some improvement in back pain.   Patient's last menstrual period was 04/17/2023 (exact date).    Review of Systems  Constitutional: Negative.   Genitourinary: Negative.   Musculoskeletal:  Positive for back pain.       Objective:    Physical Exam Constitutional:      Appearance: Normal appearance.   GU: Not indicated  BP 100/70 (BP Location: Left Arm, Patient Position: Sitting, Cuff Size: Normal)   LMP 04/17/2023 (Exact Date)  Wt Readings from Last 3 Encounters:  03/16/23 190 lb (86.2 kg)  03/11/22 196 lb (88.9 kg)  03/07/21 183 lb (83 kg)        Assessment & Plan:   Problem List Items Addressed This Visit   None Visit Diagnoses     Left lower quadrant abdominal pain    -  Primary   History of dermoid cyst excision          Vaginal ultrasound: Anteverted uterus, normal size and shape, no myometrial masses.  Symmetrical endometrium - 10.3 mm.  No masses or abnormal blood flow seen.  Both ovaries normal size with normal perfusion, 1.3 x 1.0 cm resolving corpus luteum on right side - cycle day 28.  Avascular echogenic structure on left side shows no significant change in size or appearance since previous scan (10/2021), today 1.6 x 1.3 x 1.1 cm.  No free fluid.   Plan: Ultrasound reviewed with patient. No change in size or appearance of structure. No follow up needed  unless she becomes symptomatic.       Olivia Mackie DNP, 12:29 PM 05/14/2023

## 2023-06-08 ENCOUNTER — Telehealth: Payer: Self-pay

## 2023-06-08 DIAGNOSIS — L989 Disorder of the skin and subcutaneous tissue, unspecified: Secondary | ICD-10-CM

## 2023-06-08 NOTE — Telephone Encounter (Signed)
Pt calling in inquiring if it would be possible to get a referral to a dermatologist. States that ~2 weeks ago she noticed a dry/scaly patch of skin to the right of her nose and has since then been picking at it and now has formed into a bump. Also desires to have a screening as well. Pt reports trying to call around to offices in area with no success of getting an appointment and doesn't have another provider she can ask for assistance. Please advise.

## 2023-06-08 NOTE — Telephone Encounter (Signed)
Referral faxed successfully. Pt notified and voiced understanding and provided number to location in case she doesn't hear from them re: an appt. Pt voiced appreciation. Will route to provider and close encounter.

## 2023-06-08 NOTE — Telephone Encounter (Signed)
OK to send dermatology referral. Thanks.

## 2023-08-13 ENCOUNTER — Ambulatory Visit: Payer: Medicaid Other | Admitting: Nurse Practitioner

## 2024-03-28 ENCOUNTER — Ambulatory Visit (INDEPENDENT_AMBULATORY_CARE_PROVIDER_SITE_OTHER): Payer: Medicaid Other | Admitting: Nurse Practitioner

## 2024-03-28 ENCOUNTER — Encounter: Payer: Self-pay | Admitting: Nurse Practitioner

## 2024-03-28 VITALS — BP 112/70 | HR 73 | Ht 68.0 in | Wt 188.0 lb

## 2024-03-28 DIAGNOSIS — Z23 Encounter for immunization: Secondary | ICD-10-CM

## 2024-03-28 DIAGNOSIS — Z01419 Encounter for gynecological examination (general) (routine) without abnormal findings: Secondary | ICD-10-CM | POA: Diagnosis not present

## 2024-03-28 DIAGNOSIS — Z113 Encounter for screening for infections with a predominantly sexual mode of transmission: Secondary | ICD-10-CM | POA: Diagnosis not present

## 2024-03-28 NOTE — Progress Notes (Signed)
 Monica Pratt [birth year] [id number]   History:  33 y.o. [id number] presents for annual exam. Monthly cycles. 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 2015 left ovarian dermoid cyst excision. Ultrasound 04/2023 - Avascular echogenic structure on left side shows no significant change in size or appearance since previous scan (10/2021), today 1.6 x 1.3 x 1.1 cm. Asymptomatic. Requesting Tdap. Reports last one was before daughter was born, so around 12 years ago.   Gynecologic History Patient's last menstrual period was 03/25/2024 (exact date). Period Cycle (Days): 28 Period Duration (Days): 4-5 Period Pattern: Regular Menstrual Flow: Moderate Menstrual Control: Maxi pad, Tampon Dysmenorrhea: (!) Moderate Dysmenorrhea Symptoms: Cramping, Nausea Contraception/Family planning: coitus interruptus and condoms Sexually active: Yes  Health Maintenance Last Pap: 03/16/2023. 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   ROS:  A ROS was performed and pertinent positives and negatives are included.  Exam:  Vitals:   03/28/24 1523  BP: 112/70  Pulse: 73  SpO2: 99%  Weight: 188 lb (85.3 kg)  Height: 5\' 8"  (1.727 m)      Body mass index is 28.59 kg/m.  General appearance:  Normal Thyroid:  Symmetrical, normal in size, without palpable masses or nodularity. Respiratory  Auscultation:  Clear without wheezing or rhonchi Cardiovascular  Auscultation:  Regular rate, without rubs, murmurs or gallops  Edema/varicosities:  Not grossly evident Abdominal  Soft,nontender, without masses, guarding or rebound.  Liver/spleen:  No organomegaly noted  Hernia:  None appreciated  Skin  Inspection:  Grossly normal   Breasts: Examined lying and  sitting.   Right: Without masses, retractions, discharge or axillary adenopathy.   Left: Without masses, retractions, discharge or axillary adenopathy. Pelvic: External genitalia:  no lesions              Urethra:  normal appearing urethra with no masses, tenderness or lesions              Bartholins and Skenes: normal                 Vagina: normal appearing vagina with normal color and discharge, no lesions              Cervix: no lesions Bimanual Exam:  Uterus:  no masses or tenderness              Adnexa: no mass, fullness, tenderness              Rectovaginal: Deferred              Anus:  normal, no lesions  Patient informed chaperone available to be present for breast and pelvic exam. Patient has requested no chaperone to be present. Patient has been advised what will be completed during breast and pelvic exam.   Assessment/Plan:  33 y.o. [id number] for annual exam.   Well female exam with routine gynecological exam - Plan: CBC with Differential/Platelet, Comprehensive metabolic panel with GFR, Lipid panel. Education provided on SBEs, importance of preventative screenings, current guidelines, high calcium diet, regular exercise, and multivitamin daily.   Screening examination for STD (sexually transmitted disease) - Plan: C. trachomatis/N. 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 Will repeat at 5-year interval per guidelines.   Return in about 1 year (around 03/28/2025) for Annual.     Olivia Mackie DNP, 4:10 PM 03/28/2024

## 2024-03-29 LAB — HIV ANTIBODY (ROUTINE TESTING W REFLEX): HIV 1&2 Ab, 4th Generation: NONREACTIVE

## 2024-03-29 LAB — C. TRACHOMATIS/N. GONORRHOEAE RNA
C. trachomatis RNA, TMA: NOT DETECTED
N. gonorrhoeae RNA, TMA: NOT DETECTED

## 2024-03-29 LAB — CBC WITH DIFFERENTIAL/PLATELET
Absolute Lymphocytes: 1920 {cells}/uL (ref 850–3900)
Absolute Monocytes: 390 {cells}/uL (ref 200–950)
Basophils Absolute: 13 {cells}/uL (ref 0–200)
Basophils Relative: 0.2 %
Eosinophils Absolute: 58 {cells}/uL (ref 15–500)
Eosinophils Relative: 0.9 %
HCT: 36.1 % (ref 35.0–45.0)
Hemoglobin: 12 g/dL (ref 11.7–15.5)
MCH: 32.1 pg (ref 27.0–33.0)
MCHC: 33.2 g/dL (ref 32.0–36.0)
MCV: 96.5 fL (ref 80.0–100.0)
MPV: 10.2 fL (ref 7.5–12.5)
Monocytes Relative: 6.1 %
Neutro Abs: 4019 {cells}/uL (ref 1500–7800)
Neutrophils Relative %: 62.8 %
Platelets: 348 10*3/uL (ref 140–400)
RBC: 3.74 10*6/uL — ABNORMAL LOW (ref 3.80–5.10)
RDW: 12.2 % (ref 11.0–15.0)
Total Lymphocyte: 30 %
WBC: 6.4 10*3/uL (ref 3.8–10.8)

## 2024-03-29 LAB — COMPREHENSIVE METABOLIC PANEL WITH GFR
AG Ratio: 1.4 (calc) (ref 1.0–2.5)
ALT: 9 U/L (ref 6–29)
AST: 16 U/L (ref 10–30)
Albumin: 4.3 g/dL (ref 3.6–5.1)
Alkaline phosphatase (APISO): 45 U/L (ref 31–125)
BUN: 9 mg/dL (ref 7–25)
CO2: 27 mmol/L (ref 20–32)
Calcium: 9.6 mg/dL (ref 8.6–10.2)
Chloride: 104 mmol/L (ref 98–110)
Creat: 0.71 mg/dL (ref 0.50–0.97)
Globulin: 3.1 g/dL (ref 1.9–3.7)
Glucose, Bld: 83 mg/dL (ref 65–99)
Potassium: 3.8 mmol/L (ref 3.5–5.3)
Sodium: 138 mmol/L (ref 135–146)
Total Bilirubin: 0.5 mg/dL (ref 0.2–1.2)
Total Protein: 7.4 g/dL (ref 6.1–8.1)
eGFR: 114 mL/min/{1.73_m2} (ref >=60)

## 2024-03-29 LAB — LIPID PANEL
Cholesterol: 185 mg/dL (ref <200)
HDL: 76 mg/dL (ref >=50)
LDL Cholesterol (Calc): 94 mg/dL
Non-HDL Cholesterol (Calc): 109 mg/dL (ref <130)
Total CHOL/HDL Ratio: 2.4 (calc) (ref <5.0)
Triglycerides: 60 mg/dL (ref <150)

## 2024-03-29 LAB — RPR: RPR Ser Ql: NONREACTIVE

## 2024-03-30 ENCOUNTER — Encounter: Payer: Self-pay | Admitting: Nurse Practitioner

## 2024-12-28 ENCOUNTER — Telehealth: Payer: Self-pay | Admitting: *Deleted

## 2024-12-28 ENCOUNTER — Encounter: Payer: Self-pay | Admitting: Nurse Practitioner

## 2024-12-28 NOTE — Telephone Encounter (Signed)
 No, screenings still recommended at age 34 since aunt is not considered immediate family member (mom, dad, siblings).

## 2024-12-28 NOTE — Telephone Encounter (Signed)
 Call returned to patient.  Patient family Hx updated, paternal aunt dies of breast cancer at age 36.   Patient asking if she needs to start screening early?   Next AEX 04/05/25 Last AEX 03/28/24

## 2024-12-28 NOTE — Telephone Encounter (Signed)
 Spoke with patient, advised per Tiffany.  Patient states she is not certain that is correct information based on her research, patient is asking for recommendations to locations for imaging. Provided patient [facility] and [facility], advised imaging center will schedule screening based on guidelines. Offered office visit to further discuss concerns, patient declines. Patient states she will further discuss during AEX. Advised I will provide update to Tiffany, our office will call if any additional recommendations.   Routing to [employer].   Encounter closed.

## 2025-04-05 ENCOUNTER — Ambulatory Visit: Admitting: Nurse Practitioner
